# Patient Record
Sex: Male | Born: 1950 | Race: White | Hispanic: No | Marital: Married | Smoking: Never smoker
Health system: Southern US, Community
[De-identification: ages and names within clinical notes are randomized; demographics above are authoritative.]

## PROBLEM LIST (undated history)

## (undated) DIAGNOSIS — I252 Old myocardial infarction: Secondary | ICD-10-CM

## (undated) DIAGNOSIS — E785 Hyperlipidemia, unspecified: Secondary | ICD-10-CM

## (undated) DIAGNOSIS — K219 Gastro-esophageal reflux disease without esophagitis: Secondary | ICD-10-CM

## (undated) DIAGNOSIS — M1990 Unspecified osteoarthritis, unspecified site: Secondary | ICD-10-CM

## (undated) DIAGNOSIS — D649 Anemia, unspecified: Secondary | ICD-10-CM

## (undated) DIAGNOSIS — I2510 Atherosclerotic heart disease of native coronary artery without angina pectoris: Secondary | ICD-10-CM

## (undated) DIAGNOSIS — I219 Acute myocardial infarction, unspecified: Secondary | ICD-10-CM

## (undated) DIAGNOSIS — I10 Essential (primary) hypertension: Secondary | ICD-10-CM

## (undated) HISTORY — PX: TONSILLECTOMY: SUR1361

## (undated) HISTORY — PX: HAND SURGERY: SHX662

## (undated) HISTORY — DX: Hyperlipidemia, unspecified: E78.5

## (undated) HISTORY — DX: Atherosclerotic heart disease of native coronary artery without angina pectoris: I25.10

---

## 2003-10-03 ENCOUNTER — Ambulatory Visit (HOSPITAL_BASED_OUTPATIENT_CLINIC_OR_DEPARTMENT_OTHER): Admission: RE | Admit: 2003-10-03 | Discharge: 2003-10-03 | Payer: Self-pay | Admitting: Urology

## 2003-10-09 ENCOUNTER — Ambulatory Visit (HOSPITAL_COMMUNITY): Admission: RE | Admit: 2003-10-09 | Discharge: 2003-10-09 | Payer: Self-pay | Admitting: Urology

## 2006-09-22 DIAGNOSIS — I219 Acute myocardial infarction, unspecified: Secondary | ICD-10-CM

## 2006-09-22 HISTORY — DX: Acute myocardial infarction, unspecified: I21.9

## 2007-06-01 ENCOUNTER — Ambulatory Visit (HOSPITAL_COMMUNITY): Admission: RE | Admit: 2007-06-01 | Discharge: 2007-06-01 | Payer: Self-pay | Admitting: Ophthalmology

## 2007-06-23 HISTORY — PX: CORONARY ARTERY BYPASS GRAFT: SHX141

## 2007-06-25 HISTORY — PX: CARDIAC CATHETERIZATION: SHX172

## 2007-06-29 ENCOUNTER — Ambulatory Visit: Payer: Self-pay | Admitting: Surgery

## 2007-07-01 ENCOUNTER — Ambulatory Visit: Payer: Self-pay | Admitting: Surgery

## 2007-07-01 ENCOUNTER — Inpatient Hospital Stay (HOSPITAL_COMMUNITY): Admission: RE | Admit: 2007-07-01 | Discharge: 2007-07-06 | Payer: Self-pay | Admitting: Surgery

## 2007-07-22 ENCOUNTER — Encounter: Admission: RE | Admit: 2007-07-22 | Discharge: 2007-07-22 | Payer: Self-pay | Admitting: Surgery

## 2007-07-22 ENCOUNTER — Ambulatory Visit: Payer: Self-pay | Admitting: Surgery

## 2007-07-29 ENCOUNTER — Encounter (HOSPITAL_COMMUNITY): Admission: RE | Admit: 2007-07-29 | Discharge: 2007-09-22 | Payer: Self-pay | Admitting: Cardiovascular Disease

## 2007-08-27 HISTORY — PX: TRANSTHORACIC ECHOCARDIOGRAM: SHX275

## 2009-01-29 ENCOUNTER — Encounter: Admission: RE | Admit: 2009-01-29 | Discharge: 2009-01-29 | Payer: Self-pay | Admitting: Gastroenterology

## 2010-10-13 ENCOUNTER — Encounter: Payer: Self-pay | Admitting: Surgery

## 2011-02-04 NOTE — Discharge Summary (Signed)
Harold Garner, Harold Garner              ACCOUNT NO.:  [id number]   MEDICAL RECORD NO.:  [id number]          PATIENT TYPE:  INP   LOCATION:  2019                         FACILITY:  [facility]   PHYSICIAN:  Evelene Croon, M.D.     DATE OF BIRTH:  09/25/1950   DATE OF ADMISSION:  07/01/2007  DATE OF DISCHARGE:                               DISCHARGE SUMMARY   FINAL DIAGNOSIS:  Severe three-vessel coronary artery disease.   IN-HOSPITAL DIAGNOSIS:  Acute blood loss anemia postoperatively.   SECONDARY DIAGNOSES:  1. Hypertension.  2. Hyperlipidemia.  3. Status post cataract surgery.   IN-HOSPITAL OPERATIONS AND PROCEDURES:  1. Coronary artery bypass grafting x4 using a left internal mammary      artery graft to the left anterior descending coronary, sequential      saphenous vein graft to first and second obtuse marginal branches,      saphenous vein graft to posterior descending branch.  2. Endoscopic vein harvesting from right leg.   THE PATIENT'S HISTORY AND PHYSICAL AND HOSPITAL COURSE:  The patient is  a 60 year old gentleman who was referred by Dr. Allyson Sabal, who was in his  usual state of health until he recently underwent cataract surgery.  After that surgery it was felt the patient would require a second, more  complicated surgery under general anesthesia and he was sent to his  primary care physician for cardiac clearance.  EKG showed inferior T-  wave inversions in leads II, III and aVF.  The patient was referred to  cardiology for evaluation.  Echocardiogram done on June 08, 2007,  showed mild concentric left ventricular hypertrophy.  Left ventricular  function was normal.  Ejection fraction was greater than 55%.  The  transmitral Doppler flow pattern was suggestive of impaired left  ventricular relaxation.  There is no aortic or mitral valvular disease  noted.  The patient underwent a stress Myoview examination, which showed  significant ST elevation in inferior leads with  exercise which resulted  in end of the study.  The patient also had mild chest pain that resolved  after the study.  The stress images also showed to be abnormal.  Ejection fraction was 44%.  The patient was then taken for cardiac  catheterization June 25, 2007, which showed severe three-vessel  coronary artery disease.  Post catheterization, Dr. Laneta Simmers was  consulted.  Dr. Laneta Simmers discussed with the patient undergoing coronary  artery bypass grafting.  He discussed the risks and benefits with the  patient.  The patient acknowledged understanding and agreed to proceed.  Surgery was scheduled for July 01, 2007.  For details of the patient's  past medical history and physical exam, please see dictated H&P.   The patient was taken to the operating room July 01, 2007, where he  underwent coronary artery bypass grafting x4 using a left internal  mammary artery graft to the left anterior descending coronary,  sequential saphenous vein graft to first and second obtuse marginal  branches of the left circumflex coronary, saphenous vein graft to  posterior descending branch of the right coronary.  Endoscopic vein  harvesting from  the right leg was done.  The patient tolerated this  procedure well and was transferred to the intensive care unit in stable  condition.  Postoperatively the patient was noted to be hemodynamically  stable.  He was extubated the evening of surgery.  Post extubation the  patient noted to be alert and oriented x3, neuro intact.  The patient's  postoperative course was pretty much unremarkable.  While in the  intensive care unit, the patient's vital signs were monitored.  He was  able to be weaned from all drips.  Swan-Ganz catheter discontinued in  normal fashion.  Postoperative chest x-ray was stable.  He had minimal  drainage from chest tubes and chest tubes discontinued in a normal  fashion.  He did have slight acute blood loss anemia postop day #1 with  hemoglobin  and hematocrit of 9.4 and 28%.  This was followed closely.  By postop day #2, the patient remained stable.  He was out of bed  ambulating with cardiac rehab.  He was on room air, saturating greater  than 90%.  He did have slight volume overload and was started on  diuretics.  Hemoglobin and hematocrit remained stable postop day #2.  The patient was transferred up to 2000 postop day #2.  While on  telemetry floor, the patient's vital signs continued to be followed.  They remained stable.  He remained afebrile.  He remained saturating  greater than 90% on room air.  His weight was checked daily and his  volume overload was improving.  Blood sugars were followed  postoperatively and remained stable.  The patient was noted to have  preoperatively a hemoglobin A1c of 6.1.  The patient has no prior  history of diabetes mellitus.  Blood sugars were stable postoperatively.  The patient remained in normal sinus rhythm postoperatively.  Pulmonary  status was stable.  Incisions clean, dry and intact and healing well.  He continued to progress well with ambulation.  He was tolerating diet  well.  No nausea or vomiting noted.   The patient is tentatively ready for discharge home over the next 48  hours pending he remains stable.   FOLLOW-UP APPOINTMENTS:  A follow-up appointment will be arranged with  Dr. Laneta Simmers for in 3 weeks.  Our office will contact the patient with  this information.  The patient will need to obtain a PA and lateral  chest x-ray 30 minutes prior to this appointment.  The patient will need  to follow up with Dr. Allyson Sabal in 2 weeks.  He will need to contact Dr.  Hazle Coca office to make these arrangements.   ACTIVITY:  Patient instructed no driving until released to do so, no  heavy lifting over 10 pounds.  He was told to ambulate 3-4 times per  day, progress as tolerated, and to continue his breathing exercises.   DIET:  The patient was educated on diet to be low-fat, low-salt.    INCISIONAL CARE:  The patient was told to shower, washing his incisions  using soap and water.  He is to contact the office if he develops any  drainage or opening from any of his incision sites.   DISCHARGE MEDICATIONS:  1. Aspirin 325 mg daily.  2. Toprol XL 25 mg daily.  3. Crestor 5 mg daily.  4. Lumigan 1 both eyes daily.  5. Combigan one drop left eye b.i.d.  6. Omnipred one drop four times per day.  7. Prilosec 20 mg daily.  8. Diamox 500  mg daily.  9. Lasix 40 mg daily x3 days.  10.Potassium chloride 20 mEq daily x3 days.  11.Oxycodone 5 mg one to two tablets q.4-6h. p.r.n. pain.      Theda Belfast, [place]      Evelene Croon, M.D.  Electronically Signed    KMD/MEDQ  D:  07/04/2007  T:  07/05/2007  Job:  [id number]   cc:   Nanetta Batty, M.D.

## 2011-02-04 NOTE — Consult Note (Signed)
NEW PATIENT CONSULTATION   Harold Garner, HAUK  DOB:  1950/12/06                                        June 29, 2007  CHART #:  [id number]   REFERRING PHYSICIAN:  Dr. Nanetta Batty.   REASON FOR CONSULTATION:  Severe 3-vessel coronary disease.   CLINICAL HISTORY:  I was asked by Dr. Nanetta Batty to evaluate this 60-  year-old gentleman for consideration of coronary artery bypass graft  surgery.  He said that he was in his usual state of health until he  recently underwent cataract surgery.  Unfortunately the ophthalmologist  was unable to completely remove the cataract and it was felt that he  needed a second surgery under general anesthesia with a larger incision  in the eye to fix the problem.  He was sent to his primary care  physician, Dr. Georgina Pillion, for clearance and his electrocardiogram was  abnormal with inferior T wave inversions in leads II, III and aVF.  He  was therefore referred for cardiology evaluation.  He subsequently  underwent an echocardiogram on June 08, 2007 which showed mild  concentric left ventricular hypertrophy.  Left ventricular function was  normal with an ejection fraction of greater than 55%.  The transmitral  spectral Doppler flow pattern is suggestive of impaired left ventricular  relaxation.  There was no significant aortic or mitral valvular disease  noted.  He subsequently underwent a stress-Myoview examination which  showed significant ST elevation in inferior leads with exercise which  resolved at the end of the study.  The patient reported 2 out of 10  chest pain that resolved at the end of the study.  The stress images  showed mild to moderate perfusion defect in the apical septal, apical,  basilar anterior, mid anterior, and apical anterior walls.  There was  moderate defect reversibility.  There was also a large perfusion defect  in the apical inferior, basal inferior, mid inferoseptal, and mid  inferior wall with  moderate defect reversibility.  There was also a mild  to moderate perfusion defect in the basal anterolateral, mid  anterolateral, apical lateral walls with moderate defect reversibility.  Ejection fraction was measured at 44%.  This was felt to be a high-risk  scan and the patient subsequently underwent cardiac catheterization on  June 25, 2007 which shows severe 3-vessel disease.  The left main was  normal.  The LAD had a 90% hypodense lesion in the proximal segment  before the diagonal branch and the septal perforator.  The left  circumflex had total occlusion in the mid portion in the AV groove.  The  first marginal was also totally occluded and its mid portion with  filling by collaterals.  The right coronary artery was a dominant vessel  with 80% proximal stenosis followed by a total occlusion in the mid  portion with left-to-right collaterals filling the distal vessel.  Ejection fraction was measured at 30-35% with moderate inferobasal and  posterolateral hypokinesis.   REVIEW OF SYSTEMS:  GENERAL:  He denies any fever or chills.  He has had  no recent weight changes.  He has had mild fatigue.  EYES:  He has  problems with cataracts and underwent recent cataract surgery as  mentioned above. He has been seen by Dr. Jettie Pagan and Dr. Ashley Royalty.  ENDOCRINE:  He denies diabetes and hypothyroidism.  CARDIOVASCULAR:  As  above, he has had occasional episodes of chest tightness which he  figured was due to reflux.  He does have some orthopnea and dyspnea with  exertion.  He denies peripheral edema.  RESPIRATORY:  He denies cough  and sputum production.  GI:  He has a history of reflux-type symptoms.  He denies nausea or vomiting.  He denies dysphagia.  He has not had any  melena or bright red blood per rectum.  GU:  He denies dysuria,  hematuria.  VASCULAR:  He denies claudication and phlebitis.  NEUROLOGICAL:  He denies any focal weakness or numbness.  He denies  dizziness and syncope.  He  has never had a TIA or a stroke.  MUSCULOSKELETAL:  He does have arthralgias.  PSYCHIATRIC:  Negative.  HEMATOLOGICAL:  Negative.   ALLERGIES:  DARVON (CAUSES HYPERACTIVITY).   MEDICATIONS:  Micardis 80 mg daily, Prilosec OTC daily, baby aspirin 81  mg daily, Toprol XL 25 mg daily, Crestor 5 mg daily, Diamox two daily,  Combigen eye drops 2 per day, Lumigan ophthalmic drops 1 per day, and  steroid eye drops 4 per day.   PAST MEDICAL HISTORY:  1. Hypertension for approximately 5 years.  2. History of hyperlipidemia.   PAST SURGICAL HISTORY:  He has had no previous surgery.   SOCIAL HISTORY:  He is married and has no children.  He lives with his  wife.  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  He  smoked cigarettes in the past but quit.  He smokes about one cigar per  day.  He drinks an occasional beer.   FAMILY HISTORY:  His mother died at 55 of a heart attack.  Father died  at 54 of cancer.  He has brothers who have heart disease.   PHYSICAL EXAMINATION:  VITAL SIGNS:  His blood pressure is 117/77, his  pulse is 52 and regular, respiratory rate is 18 and unlabored.  Oxygen  saturation on room air is 98%.  GENERAL:  He is a well-developed white male in no distress.  HEENT EXAM:  Shows him to be normocephalic and atraumatic.  Pupils are  equal, round and reactive to light and accommodation. Extraocular  muscles are intact.  His throat is clear.  NECK EXAM:  Shows normal carotid pulses bilaterally.  There are no  bruits. There is no adenopathy or thyromegaly.  CARDIAC EXAM:  Shows a regular rate and rhythm with normal S1 and S2.  There is no murmur, rub, or gallop.  LUNGS:  Clear.  ABDOMEN:  Active bowel sounds.  His abdomen is soft and nontender with  no palpable masses or organomegaly.  EXTREMITIES:  No peripheral edema. Pedal pulses are palpable  bilaterally.  SKIN:  Warm and dry.  NEUROLOGIC EXAM:  Shows him to be alert and oriented x3.  Motor and  sensory  exams are grossly normal.   LABORATORY EXAMINATION:  His carotid Doppler examination shows 0-49%  internal carotid artery stenosis bilaterally.  His blood test on  June 21, 2007 showed a hemoglobin of 16, hematocrit of 46, platelet  count 264,000.  His coagulation profile was normal.  Electrolytes were  normal with a BUN of 11, creatinine 0.6 and glucose of 129.  Urinalysis  was negative.  His TSH level was 1.87 which is within normal limits.   IMPRESSION:  Mr. Yi has severe 3-vessel coronary disease with a  markedly positive stress-test.  He has an occluded left circumflex and  right coronary arteries with high-grade proximal LAD stenosis.  I agree  that coronary artery bypass graft surgery is the best treatment to  prevent further ischemia and infarction and death.  I discussed the  operative procedure with the patient and his wife including benefits and  risks.  We discussed the risk of bleeding,  blood transfusion, infection, stroke, myocardial infarction, graft  failure, and death.  He understands and would like to proceed as quickly  as possible.  We will plan to do surgery this Thursday, July 01, 2007.   Evelene Croon, M.D.  Electronically Signed   BB/MEDQ  D:  06/29/2007  T:  06/29/2007  Job:  [id number]   cc:   Oley Balm. Georgina Pillion, M.D.  Nanetta Batty, M.D.

## 2011-02-04 NOTE — Discharge Summary (Signed)
Harold Garner, Harold Garner              ACCOUNT NO.:  [id number]   MEDICAL RECORD NO.:  [id number]          PATIENT TYPE:  INP   LOCATION:  2019                         FACILITY:  [facility]   PHYSICIAN:  Theda Belfast, PA DATE OF BIRTH:  08/03/1951   DATE OF ADMISSION:  07/01/2007  DATE OF DISCHARGE:  07/06/2007                               DISCHARGE SUMMARY   ADDENDUM:  The patient will be ready for discharge home within the next  48 hours if he remains stable.  The patient did remain stable.  Vital  signs continued to be monitored.  He was afebrile, breathing on room air  with saturations greater than 90%.  He remained in normal sinus rhythm.  Pulmonary status stable.  All incisions were clean, dry, and intact.  The patient was ready for discharge home postop day 5, July 06, 2007.  Please see dictated discharge summary for followup appointments and  discharge instructions.   DISCHARGE MEDICATIONS:  1. Aspirin 325 mg daily.  2. Toprol XL 25 mg daily.  3. Crestor 5 mg daily.  4. Lumigan 1 drop to left eye daily.  5. Combigan 1 drop left eye b.i.d.  6. Omni-Ped 1 drop 4 times per day.  7. Prilosec OTC 20 mg daily.  8. Diamox 400 mg daily.  9. Lasix 40 mg daily x3 days.  10.Potassium chloride 20 mEq daily x3 days.  11.Oxycodone 5 mg one to two tabs every 4-6 hours p.r.n. pain.      Theda Belfast, PA     KMD/MEDQ  D:  08/25/2007  T:  08/25/2007  Job:  [id number]   cc:   Evelene Croon, M.D.  Nanetta Batty, M.D.

## 2011-02-04 NOTE — Op Note (Signed)
NAMEJERED, Harold Garner              ACCOUNT NO.:  [id number]   MEDICAL RECORD NO.:  [id number]          PATIENT TYPE:  INP   LOCATION:  2305                         FACILITY:  [facility]   PHYSICIAN:  Evelene Croon, M.D.     DATE OF BIRTH:  1951-05-17   DATE OF PROCEDURE:  07/01/2007  DATE OF DISCHARGE:                               OPERATIVE REPORT   PREOPERATIVE DIAGNOSIS:  Severe three vessel coronary disease.   POSTOPERATIVE DIAGNOSIS:  Severe three vessel coronary disease.   OPERATIVE PROCEDURE:  Median sternotomy, extracorporeal circulation,  coronary bypass graft surgery x4 using a left internal mammary artery  graft to left anterior descending coronary, with a sequential saphenous  vein graft to the first and second obtuse marginal branches of the left  circumflex coronary, and a saphenous vein graft to the posterior  descending branch of the right coronary.  Endoscopic vein harvesting  from the right leg.   ATTENDING SURGEON:  Evelene Croon, M.D.   ASSISTANT:  Rowe Clack, P.A.-C.   ANESTHESIA:  General endotracheal.   CLINICAL HISTORY:  This patient is a 60 year old gentleman, referred by  Dr. Nanetta Batty, who was in his usual state of health until he  recently underwent cataract surgery.  After that surgery, it was felt  the patient would require a second more complicated surgery under  general anesthesia and he was sent to his primary care physician for  cardiac clearance.  Electrocardiogram showed inferior T-wave inversions  in leads 2, 3, and AVF, and he was referred to cardiology for  evaluation.  An echocardiogram on June 08, 2007, showed mild  concentric left ventricular hypertrophy.  Left ventricular function was  normal and ejection fraction was greater than 55%.  The transmitral  Doppler flow pattern was suggestive of impaired left ventricular  relaxation.  There is no aortic or mitral valvular disease noted.  He  had a stress Myoview examination which  showed significant ST elevation  in the inferior leads with exercise which resolved at the end of the  study.  The patient also had mild chest pain that resolved after the  study.  The stress images showed moderate perfusion defect throughout  the anterior, lateral, inferior walls with moderate reversibility.  Ejection fraction was 44%.   This was felt to be a high risk scan and the patient underwent cardiac  catheterization on October 3 which showed severe three vessel disease.  The left main was short and normal.  The LAD had a 90% hypodense lesion  in the proximal segment before the diagonal branch and the septal  perforator.  The left circumflex had total occlusion in the mid portion.  The first marginal branch was also totally occluded in its mid portion  and filled by collaterals.  The right coronary was a dominant vessel  with 80% proximal stenosis followed by total occlusion of the mid  portion with left-to-right collaterals filling the distal vessel.  Ejection fraction was estimated at 30-35% with moderate inferior basal  and posterolateral hypokinesis.  After review of the angiogram and  examination of the patient, I felt that  coronary bypass graft surgery  was the best treatment to further ischemia and infarction.  I discussed  the operative procedure with the patient and his wife including  alternatives, benefits, and risks including but not limited to bleeding,  blood transfusion, infection, stroke, myocardial infarction, graft  failure, and death.  He understood and agreed to proceed.   OPERATIVE PROCEDURE:  The patient was taken to the operating room and  placed on the table in a supine position.  After induction of general  endotracheal anesthesia, a Foley catheter was placed in the bladder  using sterile technique.  Then, the chest, abdomen and both lower  extremities were prepped and draped in the usual sterile manner.  The  chest was entered through a median  sternotomy incision and the  pericardium opened in the midline.  Examination of the heart showed good  ventricular contractility.  The ascending aorta was of normal size and  had no palpable plaques in it.   Then, the left internal mammary artery was harvested from the chest wall  as a pedicle graft.  This was a medium caliber vessel with excellent  blood flow through it.  At the same time, a segment of greater saphenous  vein was harvested from the right leg using endoscopic vein harvest  technique.  This vein was of medium size and good quality.   Then, the patient was heparinized and when an adequate activated  clotting time was achieved, the distal ascending aorta was cannulated  using a 20 French aortic cannula for arterial inflow.  Venous outflow  was achieved using a two stage venous cannula through the right atrial  appendage.  An antegrade cardioplegia and vent cannula was inserted in  the aortic root.   The patient was placed on cardiopulmonary bypass and the distal  coronaries identified.  The LAD was intramyocardial throughout its  entire extent and never was seen on the surface of the heart.  I was  able to localize this artery in its mid portion.  It was a medium size  graftable vessel that was lying deep in the myocardium.  The left  circumflex gave off a small first marginal that was graftable and a  moderate size second marginal that was graftable.  The right coronary  artery gave off a moderate sized posterior descending branch that was  suitable for grafting and also had a smaller posterolateral branch that  was not large enough to graft.  These appeared to communicate on  catheterization.   Then, the aorta was crossclamped and 1000 mL of cold blood antegrade  cardioplegia was administered in the aortic root with quick arrest the  heart.  Systemic hypothermia to 28 degrees centigrade and topical  hypothermia with iced saline was used.  A temperature probe was  placed  in the septum and insulating pad in the pericardium.   The first distal anastomosis was performed to the posterior descending  coronary.  The internal diameter of this vessel was about 1.75 mm.  The  conduit used was a segment of greater saphenous vein and the anastomosis  was performed in an end-to-side manner using continuous 7-0 Prolene  suture.  Flow was noted through the graft and was excellent.  Then, a  dose of cardioplegia was given down this vein graft.   The second distal anastomosis was performed to the first obtuse marginal  branch.  The internal diameter was about 1.5 mm.  The conduit used was a  second segment of greater  saphenous vein.  The anastomosis was performed  in a sequential side-to-side manner using continuous 7-0 Prolene suture.  Flow was noted through the graft and was excellent.   The third distal anastomosis was performed to the second marginal  branch.  The internal diameter of this vessel was about 1.6 mm.  The  conduit used was the same segment of greater saphenous vein and the  anastomosis was performed in a sequential end-to-side manner using  continuous 7-0 Prolene suture.  Flow was noted through the graft and was  excellent.  Then, another dose of cardioplegia was given down both vein  grafts and in the aortic root.   The fourth distal anastomosis was performed to the mid portion of the  left anterior descending coronary artery.  The internal diameter of this  vessel was about 2 mm.  The conduit used was the left internal mammary  graft and this was brought through an opening in the left pericardium  anterior to the phrenic nerve.  It was anastomosed to the LAD in an end-  to-side manner using continuous 8-0 Prolene suture.  The anastomosis was  checked for hemostasis and then the pedicle was sutured to the  epicardium with 6-0 Prolene sutures.   The patient was then rewarmed to 37 degrees centigrade.  Another dose of  cardioplegia given  and then with the crossclamp in place, the two  proximal vein graft anastomosis were performed to the aortic root in an  end-to-side manner using continuous 6-0 Prolene suture.  The clamp was  removed from the mammary pedicle.  There was rapid warming of the  ventricular septum and return of spontaneous ventricular fibrillation.  The crossclamp was removed with a time of 91 minutes and the patient was  defibrillated into sinus rhythm.  The proximal and distal anastomoses  appeared hemostatic and the lie of the grafts satisfactory.  Graft  markers were placed around the proximal anastomoses.  Two temporary  right ventricular and right atrial pacing wires were placed and brought  through the skin.   When the patient had rewarmed to 37 degrees centigrade, he was weaned  from cardiopulmonary bypass on no inotropic agents.  Total bypass time  was 111 minutes.  Cardiac function appeared excellent with a cardiac  output of 6 liters minute.  Protamine was given and venous and aortic  cannulae were removed without difficulty.  Hemostasis was achieved.  Three chest tubes were placed with two in the posterior pericardium, one  in the left pleural space, and one in the anterior mediastinum.  The  sternum was then closed with double #6 stainless steel wires.  The  fascia was closed with continuous #1 Vicryl suture.  The subcu tissue  was closed with continuous 2-0 Vicryl and the skin with 3-0 Vicryl  subcuticular closure.  The lower extremity vein harvest site was closed  in layers in a similar manner.  The sponge, needle and instrument counts  were correct according to the scrub nurse.  Dry sterile dressing was  applied over the incisions and around the chest tubes which were hooked  to Pleur-Evac suction.  The patient remained hemodynamically stable and  was transferred to the SICU in guarded but stable condition.      Evelene Croon, M.D.  Electronically Signed     BB/MEDQ  D:  07/01/2007   T:  07/01/2007  Job:  [id number]   cc:   Nanetta Batty, M.D.

## 2011-02-04 NOTE — Assessment & Plan Note (Signed)
OFFICE VISIT   Harold, Garner  DOB:  [birth year]                                        July 22, 2007  CHART #:  [id number]   Mr. Harold Garner returns for followup today status post coronary artery bypass  graft surgery x4 on 07/01/07.  He has been feeling well overall and is  walking daily without chest pain or shortness of breath.   PHYSICAL EXAMINATION:  VITAL SIGNS:  Blood pressure 133/84, pulse 78 and  regular, respiratory rate 18 unlabored, oxygen saturation on room air is  99%.  GENERAL:  He looks well.  CARDIOVASCULAR:  Regular rate and rhythm with normal heart sounds.  LUNGS:  His lung exam is clear.  CHEST:  The chest incision is healing well, the sternum is stable.  His leg incision is healing well and there is no lower extremity edema.   Followup chest x-ray shows a small left pleural effusion.  Lungs are,  otherwise, clear.   His medications are:  1. Toprol XL 25 mg daily.  2. Crestor 5 mg daily.  3. Diamox 500 mg daily.  4. Prilosec OTC 20 mg daily.  5. Lumigan eye drops.  6. Combigan eye drops.  7. Omnipred eye drops.   IMPRESSION:  Overall, Harold Garner is recovering very well following his  surgery.  I encouraged him to continue walking as much as possible.  I  told him he could return to driving a car, should refrain from lifting  anything heavier than 10 lb for a total of 3 months from date of  surgery.  He will continue to followup Dr. Nanetta Batty and will  contact me if he develops any problems with his incisions.   Evelene Croon, M.D.  Electronically Signed   BB/MEDQ  D:  07/22/2007  T:  07/23/2007  Job:  [id number]   cc:   Nanetta Batty, M.D.  Oley Balm Georgina Pillion, M.D.

## 2011-02-07 NOTE — Op Note (Signed)
NAME:  Harold Garner, Harold Garner                        ACCOUNT NO.:  [id number]   MEDICAL RECORD NO.:  [id number]                   PATIENT TYPE:  AMB   LOCATION:  [facility]                                 FACILITY:  [facility]   PHYSICIAN:  Claudette Laws, M.D.               DATE OF BIRTH:  1951-01-11   DATE OF PROCEDURE:  10/03/2003  DATE OF DISCHARGE:                                 OPERATIVE REPORT   PREOPERATIVE DIAGNOSES:  A 1 cm x 5 mm mid left ureteral stone with  hydronephrosis and renal colic.   POSTOPERATIVE DIAGNOSES:  A 1 cm x 5 mm mid left ureteral stone with  hydronephrosis and renal colic.   OPERATION:  1. Cystoscopy.  2. Left retrograde pyelogram.  3. Insertion of a 6 French 28 cm double-J stent.   SURGEON:  Claudette Laws, M.D.   HISTORY:  This is a 60 year old man, who presented to my office yesterday  with severe left-sided renal colic and by noncontrast CT, about 1 cm x 4-5  mm stone in the proximal left ureter with high-grade hydronephrosis.  He was  given a shot of Toradol and then I thought because of the degree of  obstruction and the severe pain he was having, we should go ahead and put up  a double-J stent and then consider him for a lithotripsy.  Both he and his  wife understood and agreed to the proposed follow-up.   DESCRIPTION OF PROCEDURE:  The patient was prepped and draped in the dorsal  lithotomy position under LMA anesthesia.  Cystoscopy was performed with a 22  French rigid cystoscope.  He had a normal anterior urethra, some early BPH,  anterior notching.  The bladder itself looked normal, smooth, no tumors, no  calculi, and normal ureteral orifices.   Initially I passed up a 0.038 Glidewire using fluoroscopic control.  This  was passed through a 6 [place] open-ended ureteral catheter.  We then  performed left retrograde study, showing the left hydronephrosis.  No  obvious filling defects.  We were able to bypass the stone.  I then passed  the Glidewire  back up, and we put up in a 6 French 26 cm stent which proved  to be too short, as it had migrated out of the pelvis.  So I converted to a  6 French 28 cm double-J stent, and this seemed to curl up nicely in the left  renal pelvis.  The distal end was curled up in the bladder.  The bladder was  emptied, and all instruments were removed.  A B&O suppository was placed per  rectum for bladder spasms and 10 mL of 1% Xylocaine jelly was placed per  urethra for anesthetic purposes.  The patient was then taken back to the  recovery room in satisfactory condition.  Claudette Laws, M.D.    RFS/MEDQ  D:  10/03/2003  T:  10/03/2003  Job:  [id number]

## 2011-06-25 HISTORY — PX: NM MYOCAR PERF WALL MOTION: HXRAD629

## 2011-07-03 LAB — I-STAT EC8
BUN: 8
BUN: 9
Bicarbonate: 20.6
Chloride: 105
Glucose, Bld: 100 — ABNORMAL HIGH
Glucose, Bld: 83
Hemoglobin: 10.2 — ABNORMAL LOW
Hemoglobin: 9.2 — ABNORMAL LOW
Potassium: 3.5
Potassium: 3.6
Sodium: 140
Sodium: 143
pH, Arterial: 7.362

## 2011-07-03 LAB — CBC
HCT: 28 — ABNORMAL LOW
HCT: 30.2 — ABNORMAL LOW
HCT: 42.5
Hemoglobin: 11.1 — ABNORMAL LOW
Hemoglobin: 9.2 — ABNORMAL LOW
Hemoglobin: 9.4 — ABNORMAL LOW
Hemoglobin: 9.8 — ABNORMAL LOW
MCHC: 33.9
MCV: 90
Platelets: 142 — ABNORMAL LOW
Platelets: 143 — ABNORMAL LOW
Platelets: 190
Platelets: 228
RBC: 3.15 — ABNORMAL LOW
RDW: 12.6
RDW: 12.8
RDW: 12.9
RDW: 13.2
RDW: 13.3
WBC: 10.9 — ABNORMAL HIGH
WBC: 11 — ABNORMAL HIGH
WBC: 11.8 — ABNORMAL HIGH

## 2011-07-03 LAB — POCT I-STAT 3, ART BLOOD GAS (G3+)
Acid-base deficit: 1
Acid-base deficit: 2
Acid-base deficit: 4 — ABNORMAL HIGH
Bicarbonate: 20.7
Bicarbonate: 21.7
Bicarbonate: 24.1 — ABNORMAL HIGH
O2 Saturation: 99
Operator id: 261841
Operator id: 3390
Patient temperature: 37
Patient temperature: 37.4
TCO2: 22
TCO2: 23
TCO2: 25
pCO2 arterial: 34.8 — ABNORMAL LOW
pH, Arterial: 7.37
pH, Arterial: 7.381
pH, Arterial: 7.394
pO2, Arterial: 270 — ABNORMAL HIGH
pO2, Arterial: 332 — ABNORMAL HIGH
pO2, Arterial: 56 — ABNORMAL LOW

## 2011-07-03 LAB — BASIC METABOLIC PANEL
Calcium: 8.1 — ABNORMAL LOW
Chloride: 112
GFR calc Af Amer: >60
GFR calc non Af Amer: >60
GFR calc non Af Amer: >60
GFR calc non Af Amer: >60
Glucose, Bld: 105 — ABNORMAL HIGH
Glucose, Bld: 117 — ABNORMAL HIGH
Potassium: 3.8
Potassium: 3.8
Sodium: 138
Sodium: 139
Sodium: 141

## 2011-07-03 LAB — HEMOGLOBIN A1C: Mean Plasma Glucose: 140

## 2011-07-03 LAB — POCT I-STAT 4, (NA,K, GLUC, HGB,HCT)
Glucose, Bld: 107 — ABNORMAL HIGH
Glucose, Bld: 114 — ABNORMAL HIGH
Glucose, Bld: 121 — ABNORMAL HIGH
Glucose, Bld: 83
HCT: 26 — ABNORMAL LOW
HCT: 28 — ABNORMAL LOW
HCT: 28 — ABNORMAL LOW
Hemoglobin: 8.8 — ABNORMAL LOW
Operator id: 261841
Operator id: 299391
Operator id: 3390
Operator id: 3390
Potassium: 3.5
Potassium: 4.3
Potassium: 5.1
Sodium: 136
Sodium: 138
Sodium: 139
Sodium: 143

## 2011-07-03 LAB — COMPREHENSIVE METABOLIC PANEL
Albumin: 3.4 — ABNORMAL LOW
BUN: 10
Calcium: 9.1
Creatinine, Ser: 0.9
Total Protein: 6.5

## 2011-07-03 LAB — URINALYSIS, ROUTINE W REFLEX MICROSCOPIC
Hgb urine dipstick: NEGATIVE
Specific Gravity, Urine: 1.01
Urobilinogen, UA: 0.2
pH: 7.5

## 2011-07-03 LAB — POCT I-STAT 3, VENOUS BLOOD GAS (G3P V)
O2 Saturation: 89
Patient temperature: 30
pCO2, Ven: 27.5 — ABNORMAL LOW
pH, Ven: 7.473 — ABNORMAL HIGH

## 2011-07-03 LAB — TYPE AND SCREEN
ABO/RH(D): A NEG
Antibody Screen: NEGATIVE

## 2011-07-03 LAB — BLOOD GAS, ARTERIAL
Acid-base deficit: 3.2 — ABNORMAL HIGH
Patient temperature: 98.6
TCO2: 21.8
pH, Arterial: 7.404

## 2011-07-03 LAB — POCT I-STAT GLUCOSE
Glucose, Bld: 118 — ABNORMAL HIGH
Operator id: 3390

## 2011-07-03 LAB — CREATININE, SERUM
Creatinine, Ser: 0.82
Creatinine, Ser: 0.88
GFR calc non Af Amer: >60

## 2011-07-03 LAB — APTT
aPTT: 36
aPTT: 46 — ABNORMAL HIGH

## 2011-07-03 LAB — PLATELET COUNT: Platelets: 156

## 2011-07-03 LAB — PROTIME-INR
INR: 1.3
Prothrombin Time: 13.5

## 2011-07-03 LAB — ABO/RH: ABO/RH(D): A NEG

## 2012-12-31 ENCOUNTER — Emergency Department
Admission: EM | Admit: 2012-12-31 | Discharge: 2012-12-31 | Disposition: A | Discharge status: Home / Self Care | Payer: BC Managed Care – PPO | Source: Home / Self Care | Attending: Family Medicine | Admitting: Family Medicine

## 2012-12-31 ENCOUNTER — Encounter: Payer: Self-pay | Admitting: *Deleted

## 2012-12-31 DIAGNOSIS — L03031 Cellulitis of right toe: Secondary | ICD-10-CM

## 2012-12-31 DIAGNOSIS — L03039 Cellulitis of unspecified toe: Secondary | ICD-10-CM

## 2012-12-31 HISTORY — DX: Old myocardial infarction: I25.2

## 2012-12-31 HISTORY — DX: Essential (primary) hypertension: I10

## 2012-12-31 MED ORDER — DOXYCYCLINE HYCLATE 100 MG PO CAPS: 100.0000 mg | ORAL_CAPSULE | Freq: Two times a day (BID) | ORAL | Status: AC

## 2012-12-31 MED ORDER — METRONIDAZOLE 500 MG PO TABS: 500.0000 mg | ORAL_TABLET | Freq: Three times a day (TID) | ORAL | Status: DC

## 2012-12-31 NOTE — ED Provider Notes (Signed)
History     CSN: [id number]  Arrival date & time 12/31/12  6295   First MD Initiated Contact with Patient 12/31/12 [phone]      Chief Complaint  Patient presents with  . toe infection     HPI   R great toe pain x 2 days.  Pt reports mild swelling and redness around nail bed.  Had similar episode last year on L great toe and had partial toe nail removal.  Pain is affecting both sides of nail bed.  No fevers or chills.  Non diabetic.   Past Medical History  Diagnosis Date  . Hypertension   . History of heart attack     Past Surgical History  Procedure Laterality Date  . Coronary artery bypass graft      Family History  Problem Relation Age of Onset  . Heart attack Mother     History  Substance Use Topics  . Smoking status: Light Tobacco Smoker  . Smokeless tobacco: Never Used  . Alcohol Use: Yes      Review of Systems  All other systems reviewed and are negative.    Allergies  Darvon  Home Medications   Current Outpatient Rx  Name  Route  Sig  Dispense  Refill  . aspirin 81 MG tablet   Oral   Take 81 mg by mouth daily.         . dorzolamide-timolol (COSOPT) 22.3-6.8 MG/ML ophthalmic solution      1 drop 2 (two) times daily.         Marland Kitchen ezetimibe (ZETIA) 10 MG tablet   Oral   Take 10 mg by mouth daily.         . metoprolol succinate (TOPROL-XL) 50 MG 24 hr tablet   Oral   Take 50 mg by mouth daily. Take with or immediately following a meal.           BP 145/85  Pulse 50  Temp(Src) 97.9 F (36.6 C) (Oral)  Resp 16  Ht 6\' 3"  (1.905 m)  Wt 255 lb (115.667 kg)  BMI 31.87 kg/m2  SpO2 98%  Physical Exam  Constitutional: He appears well-developed and well-nourished.  HENT:  Head: Normocephalic and atraumatic.  Eyes: Conjunctivae are normal. Pupils are equal, round, and reactive to light.  Neck: Normal range of motion.  Cardiovascular: Normal rate and regular rhythm.   Pulmonary/Chest: Effort normal.  Abdominal: Soft.    Musculoskeletal:       Feet:  Neurological: He is alert.  Skin: Skin is warm.    ED Course  Procedures (including critical care time)  Labs Reviewed - No data to display No results found.   1. Paronychia, toe, right       MDM  Discussed treatment options including toe nail avulsion.  Pt would like to try oral abx and soaks before toe nail removal.  Discussed general and infectious red flags.  Will place on course of doxy and flagyl given toe involvement.  Follow up if sxs worsen as will likely need toe nail avulsion.     The patient and/or caregiver has been counseled thoroughly with regard to treatment plan and/or medications prescribed including dosage, schedule, interactions, rationale for use, and possible side effects and they verbalize understanding. Diagnoses and expected course of recovery discussed and will return if not improved as expected or if the condition worsens. Patient and/or caregiver verbalized understanding.             Doree Albee, MD  12/31/12 0945 

## 2012-12-31 NOTE — ED Notes (Signed)
Freedom c/o pain, redness in and swelling in the nail bed of his right great toe x yesterday. This problem has occurred before in his left great toe. No hx of gout. Used antibiotic ointment.

## 2013-05-12 ENCOUNTER — Other Ambulatory Visit: Payer: Self-pay | Admitting: Cardiovascular Disease

## 2013-05-13 NOTE — Telephone Encounter (Signed)
Rx was sent to pharmacy electronically. 

## 2013-06-06 ENCOUNTER — Telehealth (HOSPITAL_COMMUNITY): Payer: Self-pay | Admitting: *Deleted

## 2013-06-13 ENCOUNTER — Telehealth (HOSPITAL_COMMUNITY): Payer: Self-pay | Admitting: *Deleted

## 2013-06-20 ENCOUNTER — Telehealth (HOSPITAL_COMMUNITY): Payer: Self-pay | Admitting: *Deleted

## 2013-06-23 ENCOUNTER — Telehealth (HOSPITAL_COMMUNITY): Payer: Self-pay | Admitting: *Deleted

## 2013-07-13 ENCOUNTER — Encounter: Payer: Self-pay | Admitting: Cardiovascular Disease

## 2013-07-13 ENCOUNTER — Ambulatory Visit (INDEPENDENT_AMBULATORY_CARE_PROVIDER_SITE_OTHER): Payer: BC Managed Care – PPO | Admitting: Cardiovascular Disease

## 2013-07-13 VITALS — BP 130/90 | HR 48 | Ht 75.0 in | Wt 251.1 lb

## 2013-07-13 DIAGNOSIS — R5383 Other fatigue: Secondary | ICD-10-CM

## 2013-07-13 DIAGNOSIS — I2510 Atherosclerotic heart disease of native coronary artery without angina pectoris: Secondary | ICD-10-CM | POA: Insufficient documentation

## 2013-07-13 DIAGNOSIS — I10 Essential (primary) hypertension: Secondary | ICD-10-CM | POA: Insufficient documentation

## 2013-07-13 DIAGNOSIS — R5381 Other malaise: Secondary | ICD-10-CM

## 2013-07-13 DIAGNOSIS — E785 Hyperlipidemia, unspecified: Secondary | ICD-10-CM | POA: Insufficient documentation

## 2013-07-13 DIAGNOSIS — Z79899 Other long term (current) drug therapy: Secondary | ICD-10-CM

## 2013-07-13 LAB — CBC
Hemoglobin: 16.4 g/dL (ref 13.0–17.0)
MCH: 31.5 pg (ref 26.0–34.0)
MCV: 90.2 fL (ref 78.0–100.0)
RBC: 5.21 MIL/uL (ref 4.22–5.81)

## 2013-07-13 LAB — COMPREHENSIVE METABOLIC PANEL
ALT: 13 U/L (ref 0–53)
BUN: 12 mg/dL (ref 6–23)
CO2: 25 mEq/L (ref 19–32)
Calcium: 9.2 mg/dL (ref 8.4–10.5)
Chloride: 105 mEq/L (ref 96–112)
Creat: 0.78 mg/dL (ref 0.50–1.35)

## 2013-07-13 LAB — LIPID PANEL: Cholesterol: 241 mg/dL — ABNORMAL HIGH (ref 0–200)

## 2013-07-13 LAB — TSH: TSH: 1.547 u[IU]/mL (ref 0.350–4.500)

## 2013-07-13 MED ORDER — EZETIMIBE 10 MG PO TABS: 10.0000 mg | ORAL_TABLET | Freq: Every day | ORAL | Status: DC

## 2013-07-13 MED ORDER — METOPROLOL SUCCINATE ER 25 MG PO TB24: ORAL_TABLET | ORAL | Status: DC

## 2013-07-13 NOTE — Assessment & Plan Note (Signed)
Borderline controlled on current medications 

## 2013-07-13 NOTE — Patient Instructions (Signed)
  We will see you back in follow up in 1 year with Dr Berry  Dr Berry has ordered blood work to be done fasting.    

## 2013-07-13 NOTE — Progress Notes (Signed)
07/13/2013 Harold Garner   [birth year]  [id number]  Primary Physician Harold Espy, MD Primary Cardiologist: Harold Gess MD Harold Garner   HPI:  The patient returns today for annual followup. He is a 62 year old moderately overweight married Caucasian male with no children who I last saw a year ago. He has a history of CAD status post coronary artery bypass grafting in October of 2008 by Dr. Evelene Garner. He had a LIMA to his LAD, a vein to a 1st and 2nd marginal branch sequentially, and a vein to the PDA. His EF was 35% at that time with followup echo revealing normalization of his ejection fraction 12/08. He also has hyperlipidemia, intolerant to statin drugs. He denies chest pain or shortness of breath.     Current Outpatient Prescriptions  Medication Sig Dispense Refill  . aspirin 81 MG tablet Take 81 mg by mouth daily.      . Coenzyme Q10 (COQ10) 100 MG CAPS Take by mouth.      . dorzolamide-timolol (COSOPT) 22.3-6.8 MG/ML ophthalmic solution 1 drop 2 (two) times daily.      . metoprolol succinate (TOPROL-XL) 25 MG 24 hr tablet TAKE ONE TABLET BY MOUTH ONE TIME DAILY  30 tablet  2  . Multiple Vitamin (MULTIVITAMIN) tablet Take 1 tablet by mouth daily.       No current facility-administered medications for this visit.    Allergies  Allergen Reactions  . Darvon [Propoxyphene Hcl]     History   Social History  . Marital Status: Married    Spouse Name: N/A    Number of Children: N/A  . Years of Education: N/A   Occupational History  . Not on file.   Social History Main Topics  . Smoking status: Light Tobacco Smoker    Types: Cigars  . Smokeless tobacco: Never Used     Comment: chew on them mostly  . Alcohol Use: Yes  . Drug Use: No  . Sexual Activity: Not on file   Other Topics Concern  . Not on file   Social History Narrative  . No narrative on file     Review of Systems: General: negative for chills, fever, night sweats or weight  changes.  Cardiovascular: negative for chest pain, dyspnea on exertion, edema, orthopnea, palpitations, paroxysmal nocturnal dyspnea or shortness of breath Dermatological: negative for rash Respiratory: negative for cough or wheezing Urologic: negative for hematuria Abdominal: negative for nausea, vomiting, diarrhea, bright red blood per rectum, melena, or hematemesis Neurologic: negative for visual changes, syncope, or dizziness All other systems reviewed and are otherwise negative except as noted above.    Blood pressure 130/90, pulse 48, height 6\' 3"  (1.905 m), weight 251 lb 1.6 oz (113.898 kg).  General appearance: alert and no distress Neck: no adenopathy, no carotid bruit, no JVD, supple, symmetrical, trachea midline and thyroid not enlarged, symmetric, no tenderness/mass/nodules Lungs: clear to auscultation bilaterally Heart: regular rate and rhythm, S1, S2 normal, no murmur, click, rub or gallop Extremities: extremities normal, atraumatic, no cyanosis or edema  EKG sinus bradycardia at 48 without ST or T wave changes  ASSESSMENT AND PLAN:   Coronary artery disease Status post coronary artery bypass grafting October of 2008 by Dr. Rexanne Garner . He had a LIMA to his LAD, a vein to the first and second marginal branches sequentially I think the PDA.  His initial ejection fraction was 35% which had improved by 2-D echo 12/82 normal. He denies chest pain or  shortness of breath.his left Myoview stress test performed 06/25/11 was low risk and nonischemic  Essential hypertension Borderline controlled on current medications  Hyperlipidemia Intolerant to statin therapy. We will check a lipid and liver profile.      Harold Gess MD FACP,FACC,FAHA, [facility] 07/13/2013 9:43 AM

## 2013-07-13 NOTE — Assessment & Plan Note (Signed)
Intolerant to statin therapy. We will check a lipid and liver profile.

## 2013-07-13 NOTE — Assessment & Plan Note (Signed)
Status post coronary artery bypass grafting October of 2008 by Dr. Rexanne Mano . He had a LIMA to his LAD, a vein to the first and second marginal branches sequentially I think the PDA.  His initial ejection fraction was 35% which had improved by 2-D echo 12/82 normal. He denies chest pain or shortness of breath.his left Myoview stress test performed 06/25/11 was low risk and nonischemic

## 2013-07-22 ENCOUNTER — Telehealth: Payer: Self-pay | Admitting: Cardiovascular Disease

## 2013-07-22 NOTE — Telephone Encounter (Signed)
Message forwarded to Kathryn, RN to advise 

## 2013-07-22 NOTE — Telephone Encounter (Signed)
Patient would like his lab results---had these drawn a couple of weeks ago.

## 2013-07-28 ENCOUNTER — Encounter: Payer: Self-pay | Admitting: *Deleted

## 2013-07-28 NOTE — Telephone Encounter (Signed)
i spoke with patient and reviewed the lab work results

## 2013-10-20 ENCOUNTER — Other Ambulatory Visit: Payer: Self-pay | Admitting: Urology

## 2013-10-20 ENCOUNTER — Encounter (HOSPITAL_COMMUNITY): Payer: Self-pay | Admitting: *Deleted

## 2013-10-21 ENCOUNTER — Encounter (HOSPITAL_COMMUNITY): Payer: Self-pay | Admitting: Pharmacy Technician

## 2013-10-24 ENCOUNTER — Encounter (HOSPITAL_COMMUNITY): Payer: Self-pay | Admitting: General Practice

## 2013-10-24 ENCOUNTER — Encounter (HOSPITAL_COMMUNITY)
Admission: RE | Disposition: A | Discharge status: Home / Self Care | Payer: Self-pay | Source: Ambulatory Visit | Attending: Urology

## 2013-10-24 ENCOUNTER — Ambulatory Visit (HOSPITAL_COMMUNITY)
Admission: RE | Admit: 2013-10-24 | Discharge: 2013-10-24 | Disposition: A | Discharge status: Home / Self Care | Payer: BC Managed Care – PPO | Source: Ambulatory Visit | Attending: Urology | Admitting: Urology

## 2013-10-24 ENCOUNTER — Ambulatory Visit (HOSPITAL_COMMUNITY): Payer: BC Managed Care – PPO

## 2013-10-24 DIAGNOSIS — I10 Essential (primary) hypertension: Secondary | ICD-10-CM | POA: Insufficient documentation

## 2013-10-24 DIAGNOSIS — N201 Calculus of ureter: Secondary | ICD-10-CM

## 2013-10-24 DIAGNOSIS — N529 Male erectile dysfunction, unspecified: Secondary | ICD-10-CM | POA: Insufficient documentation

## 2013-10-24 DIAGNOSIS — N200 Calculus of kidney: Secondary | ICD-10-CM | POA: Insufficient documentation

## 2013-10-24 DIAGNOSIS — I499 Cardiac arrhythmia, unspecified: Secondary | ICD-10-CM | POA: Insufficient documentation

## 2013-10-24 DIAGNOSIS — E7800 Pure hypercholesterolemia, unspecified: Secondary | ICD-10-CM | POA: Insufficient documentation

## 2013-10-24 DIAGNOSIS — H409 Unspecified glaucoma: Secondary | ICD-10-CM | POA: Insufficient documentation

## 2013-10-24 DIAGNOSIS — I252 Old myocardial infarction: Secondary | ICD-10-CM | POA: Insufficient documentation

## 2013-10-24 DIAGNOSIS — Z79899 Other long term (current) drug therapy: Secondary | ICD-10-CM | POA: Insufficient documentation

## 2013-10-24 DIAGNOSIS — Z87891 Personal history of nicotine dependence: Secondary | ICD-10-CM | POA: Insufficient documentation

## 2013-10-24 DIAGNOSIS — Z7982 Long term (current) use of aspirin: Secondary | ICD-10-CM | POA: Insufficient documentation

## 2013-10-24 HISTORY — DX: Acute myocardial infarction, unspecified: I21.9

## 2013-10-24 HISTORY — DX: Gastro-esophageal reflux disease without esophagitis: K21.9

## 2013-10-24 SURGERY — LITHOTRIPSY, ESWL
Anesthesia: LOCAL | Laterality: Left

## 2013-10-24 MED ORDER — CIPROFLOXACIN HCL 500 MG PO TABS
500.0000 mg | ORAL_TABLET | ORAL | Status: AC
  Administered 2013-10-24: 500 mg via ORAL
  Filled 2013-10-24: qty 1

## 2013-10-24 MED ORDER — SODIUM CHLORIDE 0.9 % IV SOLN
INTRAVENOUS | Status: DC
  Administered 2013-10-24: 07:00:00 via INTRAVENOUS

## 2013-10-24 MED ORDER — DIAZEPAM 5 MG PO TABS
10.0000 mg | ORAL_TABLET | ORAL | Status: AC
  Administered 2013-10-24: 10 mg via ORAL
  Filled 2013-10-24: qty 2

## 2013-10-24 MED ORDER — OXYCODONE HCL 10 MG PO TABS: 10.0000 mg | ORAL_TABLET | ORAL | Status: DC | PRN

## 2013-10-24 MED ORDER — TAMSULOSIN HCL 0.4 MG PO CAPS: 0.4000 mg | ORAL_CAPSULE | ORAL | Status: DC

## 2013-10-24 MED ORDER — DIPHENHYDRAMINE HCL 25 MG PO CAPS
25.0000 mg | ORAL_CAPSULE | ORAL | Status: AC
  Administered 2013-10-24: 25 mg via ORAL
  Filled 2013-10-24: qty 1

## 2013-10-24 NOTE — Op Note (Signed)
See [facility] OP note scanned into chart. 

## 2013-10-24 NOTE — Discharge Instructions (Signed)
Lithotripsy, Care After °Refer to this sheet in the next few weeks. These instructions provide you with information on caring for yourself after your procedure. Your health care provider may also give you more specific instructions. Your treatment has been planned according to current medical practices, but problems sometimes occur. Call your health care provider if you have any problems or questions after your procedure. °WHAT TO EXPECT AFTER THE PROCEDURE  °· Your urine may have a red tinge for a few days after treatment. Blood loss is usually minimal. °· You may have soreness in the back or flank area. This usually goes away after a few days. The procedure can cause blotches or bruises on the back where the pressure wave enters the skin. These marks usually cause only minimal discomfort and should disappear in a short time. °· Stone fragments should begin to pass within 24 hours of treatment. However, a delayed passage is not unusual. °· You may have pain, discomfort, and feel sick to your stomach (nauseated) when the crushed fragments of stone are passed down the tube from the kidney to the bladder. Stone fragments can pass soon after the procedure and may last for up to 4 8 weeks. °· A small number of patients may have severe pain when stone fragments are not able to pass, which leads to an obstruction. °· If your stone is greater than 1 inch (2.5 cm) in diameter or if you have multiple stones that have a combined diameter greater than 1 inch (2.5 cm), you may require more than one treatment. °· If you had a stent placed prior to your procedure, you may experience some discomfort, especially during urination. You may experience the pain or discomfort in your flank or back, or you may experience a sharp pain or discomfort at the base of your penis or in your lower abdomen. The discomfort usually lasts only a few minutes after urinating. °HOME CARE INSTRUCTIONS  °· Rest at home until you feel your energy  improving. °· Only take over-the-counter or prescription medicines for pain, discomfort, or fever as directed by your health care provider. Depending on the type of lithotripsy, you may need to take antibiotics and anti-inflammatory medicines for a few days. °· Drink enough water and fluids to keep your urine clear or pale yellow. This helps "flush" your kidneys. It helps pass any remaining pieces of stone and prevents stones from coming back. °· Most people can resume daily activities within 1 2 days after standard lithotripsy. It can take longer to recover from laser and percutaneous lithotripsy. °· If the stones are in your urinary system, you may be asked to strain your urine at home to look for stones. Any stones that are found can be sent to a medical lab for examination. °· Visit your health care provider for a follow-up appointment in a few weeks. Your doctor may remove your stent if you have one. Your health care provider will also check to see whether stone particles still remain. °SEEK MEDICAL CARE IF:  °· Your pain is not relieved by medicine. °· You have a lasting nauseous feeling. °· You feel there is too much blood in the urine. °· You develop persistent problems with frequent or painful urination that does not at least partially improve after 2 days following the procedure. °· You have a congested cough. °· You feel lightheaded. °· You develop a rash or any other signs that might suggest an allergic problem. °· You develop any reaction or side   effects to your medicine(s). °SEEK IMMEDIATE MEDICAL CARE IF:  °· You experience severe back or flank pain or both. °· You see nothing but blood when you urinate. °· You cannot pass any urine at all. °· You have a fever or shaking chills. °· You develop shortness of breath, difficulty breathing, or chest pain. °· You develop vomiting that will not stop after 6 8 hours. °· You have a fainting episode. °Document Released: 09/28/2007 Document Revised: 06/29/2013  Document Reviewed: 03/24/2013 °ExitCare® Patient Information ©2014 [employer]. ° °

## 2013-10-24 NOTE — H&P (Signed)
Harold Garner is a 63 year old male seen in follow-up for a left ureteral stone.   History of Present Illness Nephrolithiasis: He has passed several stones previously. He underwent lithotripsy first in 10/91. He then underwent lithotripsy for a left ureteral stone in 1/05. CT scan in 1/05 revealed multiple, small, bilateral renal calculi. A CT scan in 6/13 again revealed bilateral small renal calculi.  Stone analysis: 90% calcium oxalate and 10% calcium phosphate    Erectile dysfunction: This has been managed in the past with PDE 5 inhibitor therapy and MUSE.  Current therapy Cialis 5 mg per daily use        Interval history: He developed left flank pain and a CT scan 09/27/13 revealed no right renal calculi and a single left renal stone. There was a 6 mm left mid ureteral stone with proximal ureteral dilatation. It has Hounsfield units of 800. He was placed on medical expulsive therapy. He has not had any further flank pain and has not had any hematuria but he has not seen his stone pass.   Past Medical History Problems  1. History of Acute Myocardial Infarction (V12.59) 2. 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 History of Calculus of distal right ureter (592.1) 4. History of Calculus of right ureter (592.1) 5. History of glaucoma (V12.49) 6. History of heartburn (V12.79) 7. History of hypercholesterolemia (V12.29) 8. History of hypertension (V12.59) 9. History of kidney stones (V13.01) 10. History of Hydronephrosis, right (591) 11. 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 History of Heart Surgery  Current Meds 1. Aspirin Low Dose 81 MG Oral Tablet Delayed Release;  Therapy: (Recorded:22Apr2010) to Recorded 2. Co Q 10 CAPS;  Therapy: (Recorded:22Apr2010) to Recorded 3. Metoprolol Tartrate 25 MG Oral Tablet;  Therapy: (Recorded:22Apr2010) to Recorded 4. Ondansetron HCl - 8 MG Oral Tablet; TAKE 1 TABLET 3 times daily PRN  nausea;  Therapy: [id number] to (Evaluate:23Dec2014)  Requested for: [id number]; Last  Rx:16Dec2014 Ordered 5. Oxycodone-Acetaminophen 5-325 MG Oral Tablet; take 1 or 2 tablets q 4-6 hours prn  pain;  Therapy: [id number] to (Last Rx:06Jan2015) Ordered 6. Promethazine HCl - 25 MG Oral Tablet; TAKE 1 TABLET EVERY 6 HOURS AS NEEDED  FOR NAUSEA;  Therapy: [id number] to (Evaluate:11Jan2015)  Requested for: [id number]; Last  Rx:06Jan2015 Ordered 7. Zetia 10 MG Oral Tablet;  Therapy: (Recorded:16Jun2011) to Recorded  Allergies Medication  1. Darvon CAPS  Family History Problems  1. Family history of Acute Myocardial Infarction (V17.3) : Mother 2. Family history of Family Health Status - Father's Age : Father   Deceased-lung caner 3. Family history of Family Health Status - Mother's Age : Father   deceased-heart attack 4. Family history of Lung Cancer (V16.1) : Father  Social History Problems  1. Alcohol Use   1 per day 2. Caffeine Use   1 per day 3. Current some day smoker (305.1) 4. Marital History - Currently Married 5. Occupation:   Water engineer 6. Tobacco Use (V15.82)   1 cigar for 25 years  Vitals Vital Signs  Height: 6 ft 3 in Weight: 240 lb  BMI Calculated: 30 BSA Calculated: 2.37 Blood Pressure: 129 / 79 Heart Rate: 50  Review of Systems Genitourinary, constitutional, skin, eye, otolaryngeal, hematologic/lymphatic, cardiovascular, pulmonary, endocrine, musculoskeletal, gastrointestinal, neurological and psychiatric system(s) were reviewed and pertinent findings if present are noted.  Genitourinary: incontinence, difficulty starting the urinary stream, weak urinary stream and erectile dysfunction.  ENT: sinus problems.  Cardiovascular: leg swelling.  Musculoskeletal: back  pain.     Physical Exam Constitutional: Well nourished and well developed. No acute distress.  ENT:. The ears and nose are normal in appearance.  Neck: The appearance of the neck is  normal and no neck mass is present.  Pulmonary: No respiratory distress and normal respiratory rhythm and effort.  Cardiovascular: Heart rate and rhythm are normal. No peripheral edema.  Abdomen: The abdomen is soft and nontender. No masses are palpated. No CVA tenderness. No hernias are palpable. No hepatosplenomegaly noted.  Rectal: Rectal exam demonstrates normal sphincter tone, no tenderness and no masses. Estimated prostate size is 1+. The prostate has no nodularity and is not tender. The left seminal vesicle is nonpalpable. The right seminal vesicle is nonpalpable. The perineum is normal on inspection.  Genitourinary: Examination of the penis demonstrates no discharge, no masses, no lesions and a normal meatus. The scrotum is without lesions. The right epididymis is palpably normal and non-tender. The left epididymis is palpably normal and non-tender. The right testis is non-tender and without masses. The left testis is non-tender and without masses.  Lymphatics: The femoral and inguinal nodes are not enlarged or tender.  Skin: Normal skin turgor, no visible rash and no visible skin lesions.  Neuro/Psych:. Mood and affect are appropriate.    Results/Data Urine  COLOR YELLOW  APPEARANCE CLEAR  SPECIFIC GRAVITY 1.015  pH 6.5  GLUCOSE NEG mg/dL BILIRUBIN NEG  KETONE NEG mg/dL BLOOD NEG  PROTEIN NEG mg/dL UROBILINOGEN 0.2 mg/dL NITRITE NEG  LEUKOCYTE ESTERASE NEG   The following images/tracing/specimen were independently visualized:  KUB: I can still see a calcification located in the same position as it was seen on his CT scan at the L4-L5 interspace level on the left-hand side.  The following clinical lab reports were reviewed:  His urine was clear today.    Assessment Assessed  1. Renal calculus, left (592.0) 2. Calculus of left ureter (592.1)  We discussed the management of urinary stones. These options include observation, ureteroscopy, shockwave lithotripsy, and PCNL. We  discussed which options are relevant to these particular stones. We discussed the natural history of stones as well as the complications of untreated stones and the impact on quality of life without treatment as well as with each of the above listed treatments. We also discussed the efficacy of each treatment in its ability to clear the stone burden. With any of these management options I discussed the signs and symptoms of infection and the need for emergent treatment should these be experienced. For each option we discussed the ability of each procedure to clear the patient of their stone burden.    For observation I described the risks which include but are not limited to silent renal damage, life-threatening infection, need for emergent surgery, failure to pass stone, and pain.    For ureteroscopy I described the risks which include heart attack, stroke, pulmonary embolus, death, bleeding, infection, damage to contiguous structures, positioning injury, ureteral stricture, ureteral avulsion, ureteral injury, need for ureteral stent, inability to perform ureteroscopy, need for an interval procedure, inability to clear stone burden, stent discomfort and pain.    For shockwave lithotripsy I described the risks which include arrhythmia, kidney contusion, kidney hemorrhage, need for transfusion, long-term risk of diabetes or hypertension, back discomfort, flank ecchymosis, flank abrasion, inability to break up stone, inability to pass stone fragments, Steinstrasse, infection associated with obstructing stones, need for different surgical procedure, need for repeat shockwave lithotripsy, and death.    He has undergone both  of these procedures in the past and has elected to proceed with lithotripsy.   Plan Calculus of left ureter    He is scheduled for lithotripsy of his left mid ureteral stone.

## 2014-03-11 ENCOUNTER — Other Ambulatory Visit: Payer: Self-pay | Admitting: Cardiovascular Disease

## 2014-03-13 ENCOUNTER — Other Ambulatory Visit: Payer: Self-pay

## 2014-03-13 NOTE — Telephone Encounter (Signed)
Rx was sent to pharmacy electronically. 

## 2014-07-10 ENCOUNTER — Other Ambulatory Visit: Payer: Self-pay | Admitting: Cardiovascular Disease

## 2014-07-11 NOTE — Telephone Encounter (Signed)
Rx was sent to pharmacy electronically. 

## 2014-08-07 ENCOUNTER — Other Ambulatory Visit: Payer: Self-pay | Admitting: Cardiovascular Disease

## 2014-08-22 ENCOUNTER — Telehealth: Payer: Self-pay | Admitting: Cardiovascular Disease

## 2014-08-22 DIAGNOSIS — Z79899 Other long term (current) drug therapy: Secondary | ICD-10-CM

## 2014-08-22 DIAGNOSIS — E785 Hyperlipidemia, unspecified: Secondary | ICD-10-CM

## 2014-08-22 NOTE — Telephone Encounter (Signed)
Pt needs labs order placed so he can come in prior to his appt to have them done. Please let him know once those are placed.  Thanks

## 2014-08-22 NOTE — Telephone Encounter (Signed)
Left message will mail lab slip--CMP, LIPID.

## 2014-08-24 ENCOUNTER — Other Ambulatory Visit: Payer: Self-pay | Admitting: Cardiovascular Disease

## 2014-08-25 NOTE — Telephone Encounter (Signed)
Rx was sent to pharmacy electronically. OV 12/29

## 2014-09-06 LAB — COMPREHENSIVE METABOLIC PANEL
ALT: 10 U/L (ref 0–53)
AST: 14 U/L (ref 0–37)
Albumin: 3.8 g/dL (ref 3.5–5.2)
Alkaline Phosphatase: 74 U/L (ref 39–117)
BUN: 12 mg/dL (ref 6–23)
CALCIUM: 8.8 mg/dL (ref 8.4–10.5)
CHLORIDE: 105 meq/L (ref 96–112)
CO2: 24 mEq/L (ref 19–32)
CREATININE: 0.76 mg/dL (ref 0.50–1.35)
GLUCOSE: 102 mg/dL — AB (ref 70–99)
Potassium: 4.3 mEq/L (ref 3.5–5.3)
Sodium: 139 mEq/L (ref 135–145)
Total Bilirubin: 0.6 mg/dL (ref 0.2–1.2)
Total Protein: 6.2 g/dL (ref 6.0–8.3)

## 2014-09-06 LAB — LIPID PANEL
Cholesterol: 229 mg/dL — ABNORMAL HIGH (ref 0–200)
HDL: 43 mg/dL
LDL Cholesterol: 152 mg/dL — ABNORMAL HIGH (ref 0–99)
Total CHOL/HDL Ratio: 5.3 ratio
Triglycerides: 169 mg/dL — ABNORMAL HIGH
VLDL: 34 mg/dL (ref 0–40)

## 2014-09-12 ENCOUNTER — Encounter: Payer: Self-pay | Admitting: *Deleted

## 2014-09-19 ENCOUNTER — Ambulatory Visit: Payer: BC Managed Care – PPO | Admitting: Cardiovascular Disease

## 2014-09-24 ENCOUNTER — Other Ambulatory Visit: Payer: Self-pay | Admitting: Cardiovascular Disease

## 2014-09-25 NOTE — Telephone Encounter (Signed)
Rx(s) sent to pharmacy electronically. OV 10/27/14

## 2014-10-27 ENCOUNTER — Ambulatory Visit (INDEPENDENT_AMBULATORY_CARE_PROVIDER_SITE_OTHER): Payer: Self-pay | Admitting: Cardiovascular Disease

## 2014-10-27 ENCOUNTER — Encounter: Payer: Self-pay | Admitting: Cardiovascular Disease

## 2014-10-27 ENCOUNTER — Encounter: Payer: Self-pay | Admitting: *Deleted

## 2014-10-27 VITALS — BP 120/84 | HR 50 | Ht 75.0 in | Wt 256.8 lb

## 2014-10-27 DIAGNOSIS — I2510 Atherosclerotic heart disease of native coronary artery without angina pectoris: Secondary | ICD-10-CM

## 2014-10-27 DIAGNOSIS — E785 Hyperlipidemia, unspecified: Secondary | ICD-10-CM

## 2014-10-27 DIAGNOSIS — I10 Essential (primary) hypertension: Secondary | ICD-10-CM

## 2014-10-27 MED ORDER — ROSUVASTATIN CALCIUM 5 MG PO TABS
5.0000 mg | ORAL_TABLET | ORAL | Status: DC
Start: 2014-10-27 — End: 2015-02-01

## 2014-10-27 MED ORDER — EZETIMIBE 10 MG PO TABS: 10.0000 mg | ORAL_TABLET | Freq: Every day | ORAL | Status: DC

## 2014-10-27 MED ORDER — METOPROLOL SUCCINATE ER 25 MG PO TB24: 25.0000 mg | ORAL_TABLET | Freq: Every day | ORAL | Status: DC

## 2014-10-27 NOTE — Assessment & Plan Note (Signed)
History of hypertension blood pressure measured at 120/84 on metoprolol succinate I 25 mg a day. Continue current meds at current dosing

## 2014-10-27 NOTE — Patient Instructions (Addendum)
Your physician wants you to follow-up in: 1 Year You will receive a reminder letter in the mail two months in advance. If you don't receive a letter, please call our office to schedule the follow-up appointment.  Start Crestor 5 mg - 3 times a week Start Zetia 10 mg - daily  Call Tommy Medal, PharmD; [phone] ext [phone] in about 2 weeks to tell her how you are tolerating the Crestor

## 2014-10-27 NOTE — Progress Notes (Signed)
10/27/2014 Harold Garner   Jul 19, 1951  [id number]  Primary Physician Harold Smolder, MD Primary Cardiologist: Harold Harp MD Harold Garner   HPI:  The patient returns today for annual followup. He is a 64 year old moderately overweight married Caucasian male with no children who I last saw a year ago. He has a history of CAD status post coronary artery bypass grafting in October of 2008 by Dr. Gilford Garner. He had a LIMA to his LAD, a vein to a 1st and 2nd marginal branch sequentially, and a vein to the PDA. His EF was 35% at that time with followup echo revealing normalization of his ejection fraction 12/08. He also has hyperlipidemia, intolerant to statin drugs. He denies chest pain or shortness of breath.   Current Outpatient Prescriptions  Medication Sig Dispense Refill  . aspirin 81 MG tablet Take 81 mg by mouth every morning.     . Coenzyme Q10 (COQ10) 100 MG CAPS Take 100 mg by mouth.     . dorzolamide-timolol (COSOPT) 22.3-6.8 MG/ML ophthalmic solution Place 1 drop into both eyes 2 (two) times daily.     . metoprolol succinate (TOPROL-XL) 25 MG 24 hr tablet Take 1 tablet (25 mg total) by mouth daily. <Appointment 10/27/2014> 30 tablet 1  . Multiple Vitamin (MULTIVITAMIN) tablet Take 1 tablet by mouth daily.     No current facility-administered medications for this visit.    Allergies  Allergen Reactions  . Darvon [Propoxyphene Hcl] Itching    History   Social History  . Marital Status: Married    Spouse Name: N/A    Number of Children: N/A  . Years of Education: N/A   Occupational History  . Not on file.   Social History Main Topics  . Smoking status: Light Tobacco Smoker    Types: Cigars  . Smokeless tobacco: Never Used     Comment: chew on them mostly  . Alcohol Use: Yes  . Drug Use: No  . Sexual Activity: Not on file   Other Topics Concern  . Not on file   Social History Narrative     Review of Systems: General: negative for  chills, fever, night sweats or weight changes.  Cardiovascular: negative for chest pain, dyspnea on exertion, edema, orthopnea, palpitations, paroxysmal nocturnal dyspnea or shortness of breath Dermatological: negative for rash Respiratory: negative for cough or wheezing Urologic: negative for hematuria Abdominal: negative for nausea, vomiting, diarrhea, bright red blood per rectum, melena, or hematemesis Neurologic: negative for visual changes, syncope, or dizziness All other systems reviewed and are otherwise negative except as noted above.    Blood pressure 120/84, pulse 50, height 6\' 3"  (1.905 m), weight 256 lb 12.8 oz (116.484 kg).  General appearance: alert and no distress Neck: no adenopathy, no carotid bruit, no JVD, supple, symmetrical, trachea midline and thyroid not enlarged, symmetric, no tenderness/mass/nodules Lungs: clear to auscultation bilaterally Heart: regular rate and rhythm, S1, S2 normal, no murmur, click, rub or gallop Extremities: extremities normal, atraumatic, no cyanosis or edema  EKG sinus bradycardia at 50 without ST or T-wave changes. I personally reviewed this EKG  ASSESSMENT AND PLAN:   Hyperlipidemia History of hyperlipidemia intolerant to statin drugs. This measures a lipid profile performed 09/05/14 revealed a total cholesterol 229, LDL 152 HDL of 43. I'm going to explore the possibility of treatment with PCSK9 monoclonal injectables.   Essential hypertension History of hypertension blood pressure measured at 120/84 on metoprolol succinate I 25 mg a day. Continue current  meds at current dosing   Coronary artery disease History of CAD status post coronary artery bypass grafting October 2008 by Dr. Arvid Garner with a LIMA to the LAD, vein to the first and second marginal branches sequentially and a vein to the PDA. His ejection fraction was 35% at that time with follow-up echo revealing normalization of this. He denies chest pain or shortness of  breath.       Harold Harp MD FACP,FACC,FAHA, [facility] 10/27/2014 12:56 PM

## 2014-10-27 NOTE — Assessment & Plan Note (Signed)
History of CAD status post coronary artery bypass grafting October 2008 by Dr. Arvid Right with a LIMA to the LAD, vein to the first and second marginal branches sequentially and a vein to the PDA. His ejection fraction was 35% at that time with follow-up echo revealing normalization of this. He denies chest pain or shortness of breath.

## 2014-10-27 NOTE — Assessment & Plan Note (Signed)
History of hyperlipidemia intolerant to statin drugs. This measures a lipid profile performed 09/05/14 revealed a total cholesterol 229, LDL 152 HDL of 43. I'm going to explore the possibility of treatment with PCSK9 monoclonal injectables.

## 2014-11-02 ENCOUNTER — Telehealth: Payer: Self-pay | Admitting: *Deleted

## 2014-11-02 NOTE — Telephone Encounter (Signed)
PA done through cover my meds for Crestor.  Med approved!  Patient has tried and failed pravachol and lipitor.  He is statin intolerant.  Approval faxed to [facility]

## 2014-11-21 ENCOUNTER — Telehealth: Payer: Self-pay | Admitting: Pharmacist Clinician (PhC)/ Clinical Pharmacy Specialist

## 2014-11-21 NOTE — Telephone Encounter (Signed)
Spoke with patient about ability to tolerate Crestor 5 mg tiw.  Pt states has had increasing muscle aches and pains.  Advised that he discontinue for 10 days, then restart at just once weekly.  Call if unable to tolerate that after 2+ doses.  Pt voiced understanding

## 2014-12-28 ENCOUNTER — Other Ambulatory Visit: Payer: Self-pay | Admitting: Family Medicine

## 2014-12-28 DIAGNOSIS — Z139 Encounter for screening, unspecified: Secondary | ICD-10-CM

## 2015-01-02 ENCOUNTER — Telehealth: Payer: Self-pay | Admitting: Pharmacist Clinician (PhC)/ Clinical Pharmacy Specialist

## 2015-01-02 DIAGNOSIS — E785 Hyperlipidemia, unspecified: Secondary | ICD-10-CM

## 2015-01-02 NOTE — Telephone Encounter (Signed)
Spoke with patient, he was not able to tolerate Crestor at once weekly dose.  Caused him to have myalgias.  Currently takes Zetia 10 mg daily.  Will mail lab orders to patient, he can have drawn in next week or two, then see me in lipid clinic on May 6.  Will determine if PCSK-9 eligible.

## 2015-01-23 ENCOUNTER — Other Ambulatory Visit: Payer: Self-pay | Admitting: Cardiovascular Disease

## 2015-01-24 LAB — HEPATIC FUNCTION PANEL
ALT: 13 U/L (ref 0–53)
AST: 16 U/L (ref 0–37)
Albumin: 3.6 g/dL (ref 3.5–5.2)
Alkaline Phosphatase: 73 U/L (ref 39–117)
Bilirubin, Direct: 0.1 mg/dL (ref 0.0–0.3)
Indirect Bilirubin: 0.5 mg/dL (ref 0.2–1.2)
Total Bilirubin: 0.6 mg/dL (ref 0.2–1.2)
Total Protein: 6 g/dL (ref 6.0–8.3)

## 2015-01-24 LAB — LIPID PANEL
Cholesterol: 182 mg/dL (ref 0–200)
HDL: 36 mg/dL — ABNORMAL LOW (ref >=40)
LDL Cholesterol: 121 mg/dL — ABNORMAL HIGH (ref 0–99)
Total CHOL/HDL Ratio: 5.1 Ratio
Triglycerides: 127 mg/dL (ref <150)
VLDL: 25 mg/dL (ref 0–40)

## 2015-01-26 ENCOUNTER — Ambulatory Visit: Payer: BLUE CROSS/BLUE SHIELD | Admitting: Pharmacist Clinician (PhC)/ Clinical Pharmacy Specialist

## 2015-02-01 ENCOUNTER — Encounter: Payer: Self-pay | Admitting: Pharmacist Clinician (PhC)/ Clinical Pharmacy Specialist

## 2015-02-01 ENCOUNTER — Ambulatory Visit (INDEPENDENT_AMBULATORY_CARE_PROVIDER_SITE_OTHER): Payer: Medicare HMO | Admitting: Pharmacist Clinician (PhC)/ Clinical Pharmacy Specialist

## 2015-02-01 VITALS — Ht 75.0 in | Wt 254.9 lb

## 2015-02-01 DIAGNOSIS — E785 Hyperlipidemia, unspecified: Secondary | ICD-10-CM | POA: Diagnosis not present

## 2015-02-01 NOTE — Patient Instructions (Signed)
Continue with your Zetia 10 mg once daily.    We will start the paperwork for [place].  It is an injection that you give yourself twice weekly.  If hear anything from your insurance company about denying this medication, please bring that paperwork by the office for [place].

## 2015-02-02 NOTE — Progress Notes (Signed)
02/02/2015 Harold Garner [birth year] [id number]   HPI:  Harold Garner is a 64 y.o. male patient of Dr. Gwenlyn Found, who presents today for a lipid clinic evaluation.  RF:  ASCVD- MI with CABG x 3 in 2008  Meds: ezetimibe 10 mg qd   Tried in the past:   Atorvastatin - chart notes from initial visit in 2008 indicate unable to tolerate  Rosuvastatin 5 mg (Oct 2008-Jan 2009)  Pravastatin 20 mg (Feb 2009-Mar 2009)  Simvastatin 20 mg (July 2010)  Rosuvastatin 5 mg three times weekly - down to once weekly (Feb 2019 - Mar 2016)  Family history: mother died from MI at age 40  Diet:  eats cereals.oatmeal for breakfast, with occasional biscuit; avoids fried foods, but does eat high carbs (breads, potatoes)  Exercise: no organized exercise, does yard work, keeps active    Labs:  Results for CARLOSDANIEL, GROB (MRN [id number]) as of 02/02/2015 09:34  Ref. Range 09/05/2014 08:02 10/27/2014 10:41 01/23/2015 09:46  Cholesterol Latest Ref Range: 0-200 mg/dL 229 (H)  182  Triglycerides Latest Ref Range: <150 mg/dL 169 (H)  127  HDL Cholesterol Latest Ref Range: >=40 mg/dL 43  36 (L)  LDL (calc) Latest Ref Range: 0-99 mg/dL 152 (H)  121 (H)     Current Outpatient Prescriptions  Medication Sig Dispense Refill  . aspirin 81 MG tablet Take 81 mg by mouth every morning.     . Coenzyme Q10 (COQ10) 100 MG CAPS Take 100 mg by mouth.     . dorzolamide-timolol (COSOPT) 22.3-6.8 MG/ML ophthalmic solution Place 1 drop into both eyes 2 (two) times daily.     Marland Kitchen ezetimibe (ZETIA) 10 MG tablet Take 1 tablet (10 mg total) by mouth daily. 30 tablet 0  . metoprolol succinate (TOPROL-XL) 25 MG 24 hr tablet Take 1 tablet (25 mg total) by mouth daily. 30 tablet 11  . Multiple Vitamin (MULTIVITAMIN) tablet Take 1 tablet by mouth daily.     No current facility-administered medications for this visit.    Allergies  Allergen Reactions  . Darvon [Propoxyphene Hcl] Itching  . Statins     Has tried pravachol, crestor and  lipitor    Past Medical History  Diagnosis Date  . Hypertension   . History of heart attack   . Coronary artery disease     status post coronary artery bypass grafting October 2008 by Dr. Arvid Right   . Hyperlipidemia     intolerant to statin drugs  . Myocardial infarction   . GERD (gastroesophageal reflux disease)     Height 6\' 3"  (1.905 m), weight 254 lb 14.4 oz (115.622 kg).   Tommy Medal PharmD CPP [facility]

## 2015-02-02 NOTE — Assessment & Plan Note (Addendum)
Pt with ASCVD had LDL cholesterol drop from 152 to 121 on ezetimibe 10 mg daily.  Has attempted multiple statins with repeated myopathies.  LDL goal is <70.  Reviewed healthy lifestyle and dietary guidelines with patient.  Will file for Repatha 140 mg q14 days.  Patient aware medication is SQ, has no concerns about self-injection.

## 2015-06-26 DIAGNOSIS — H53002 Unspecified amblyopia, left eye: Secondary | ICD-10-CM | POA: Diagnosis not present

## 2015-06-26 DIAGNOSIS — H401131 Primary open-angle glaucoma, bilateral, mild stage: Secondary | ICD-10-CM | POA: Diagnosis not present

## 2015-06-26 DIAGNOSIS — H52203 Unspecified astigmatism, bilateral: Secondary | ICD-10-CM | POA: Diagnosis not present

## 2015-06-28 DIAGNOSIS — M542 Cervicalgia: Secondary | ICD-10-CM | POA: Diagnosis not present

## 2015-06-28 DIAGNOSIS — M7581 Other shoulder lesions, right shoulder: Secondary | ICD-10-CM | POA: Diagnosis not present

## 2015-08-09 DIAGNOSIS — M7581 Other shoulder lesions, right shoulder: Secondary | ICD-10-CM | POA: Diagnosis not present

## 2015-08-09 DIAGNOSIS — M542 Cervicalgia: Secondary | ICD-10-CM | POA: Diagnosis not present

## 2015-08-15 DIAGNOSIS — M25511 Pain in right shoulder: Secondary | ICD-10-CM | POA: Diagnosis not present

## 2015-08-24 DIAGNOSIS — M25511 Pain in right shoulder: Secondary | ICD-10-CM | POA: Diagnosis not present

## 2015-08-24 DIAGNOSIS — M542 Cervicalgia: Secondary | ICD-10-CM | POA: Diagnosis not present

## 2015-08-28 DIAGNOSIS — M542 Cervicalgia: Secondary | ICD-10-CM | POA: Diagnosis not present

## 2015-08-28 DIAGNOSIS — M25511 Pain in right shoulder: Secondary | ICD-10-CM | POA: Diagnosis not present

## 2015-08-30 DIAGNOSIS — M25511 Pain in right shoulder: Secondary | ICD-10-CM | POA: Diagnosis not present

## 2015-08-30 DIAGNOSIS — M542 Cervicalgia: Secondary | ICD-10-CM | POA: Diagnosis not present

## 2015-09-04 DIAGNOSIS — M542 Cervicalgia: Secondary | ICD-10-CM | POA: Diagnosis not present

## 2015-09-04 DIAGNOSIS — M25511 Pain in right shoulder: Secondary | ICD-10-CM | POA: Diagnosis not present

## 2015-09-18 DIAGNOSIS — M25511 Pain in right shoulder: Secondary | ICD-10-CM | POA: Diagnosis not present

## 2015-09-18 DIAGNOSIS — M542 Cervicalgia: Secondary | ICD-10-CM | POA: Diagnosis not present

## 2015-09-21 DIAGNOSIS — M25511 Pain in right shoulder: Secondary | ICD-10-CM | POA: Diagnosis not present

## 2015-09-21 DIAGNOSIS — M542 Cervicalgia: Secondary | ICD-10-CM | POA: Diagnosis not present

## 2015-09-27 DIAGNOSIS — M25511 Pain in right shoulder: Secondary | ICD-10-CM | POA: Diagnosis not present

## 2015-09-27 DIAGNOSIS — M542 Cervicalgia: Secondary | ICD-10-CM | POA: Diagnosis not present

## 2015-10-03 DIAGNOSIS — J321 Chronic frontal sinusitis: Secondary | ICD-10-CM | POA: Diagnosis not present

## 2015-10-03 DIAGNOSIS — R05 Cough: Secondary | ICD-10-CM | POA: Diagnosis not present

## 2015-10-04 DIAGNOSIS — M25511 Pain in right shoulder: Secondary | ICD-10-CM | POA: Diagnosis not present

## 2015-10-04 DIAGNOSIS — M542 Cervicalgia: Secondary | ICD-10-CM | POA: Diagnosis not present

## 2015-10-18 DIAGNOSIS — M25511 Pain in right shoulder: Secondary | ICD-10-CM | POA: Diagnosis not present

## 2015-10-18 DIAGNOSIS — M542 Cervicalgia: Secondary | ICD-10-CM | POA: Diagnosis not present

## 2015-10-23 DIAGNOSIS — M7581 Other shoulder lesions, right shoulder: Secondary | ICD-10-CM | POA: Diagnosis not present

## 2015-11-27 ENCOUNTER — Other Ambulatory Visit: Payer: Self-pay | Admitting: Cardiovascular Disease

## 2015-11-27 DIAGNOSIS — M7581 Other shoulder lesions, right shoulder: Secondary | ICD-10-CM | POA: Diagnosis not present

## 2015-11-27 DIAGNOSIS — M7582 Other shoulder lesions, left shoulder: Secondary | ICD-10-CM | POA: Diagnosis not present

## 2015-12-28 DIAGNOSIS — H401122 Primary open-angle glaucoma, left eye, moderate stage: Secondary | ICD-10-CM | POA: Diagnosis not present

## 2015-12-28 DIAGNOSIS — H401111 Primary open-angle glaucoma, right eye, mild stage: Secondary | ICD-10-CM | POA: Diagnosis not present

## 2016-01-16 ENCOUNTER — Other Ambulatory Visit: Payer: Self-pay | Admitting: Cardiovascular Disease

## 2016-01-17 NOTE — Telephone Encounter (Signed)
Rx has been sent to the pharmacy electronically. ° °

## 2016-01-28 ENCOUNTER — Telehealth: Payer: Self-pay | Admitting: *Deleted

## 2016-01-28 DIAGNOSIS — L821 Other seborrheic keratosis: Secondary | ICD-10-CM | POA: Diagnosis not present

## 2016-01-28 DIAGNOSIS — L304 Erythema intertrigo: Secondary | ICD-10-CM | POA: Diagnosis not present

## 2016-01-28 DIAGNOSIS — E785 Hyperlipidemia, unspecified: Secondary | ICD-10-CM | POA: Diagnosis not present

## 2016-01-28 DIAGNOSIS — Z79899 Other long term (current) drug therapy: Secondary | ICD-10-CM

## 2016-01-28 DIAGNOSIS — L820 Inflamed seborrheic keratosis: Secondary | ICD-10-CM | POA: Diagnosis not present

## 2016-01-28 LAB — HEPATIC FUNCTION PANEL
ALBUMIN: 3.6 g/dL (ref 3.6–5.1)
ALK PHOS: 89 U/L (ref 40–115)
ALT: 11 U/L (ref 9–46)
AST: 14 U/L (ref 10–35)
BILIRUBIN INDIRECT: 0.4 mg/dL (ref 0.2–1.2)
BILIRUBIN TOTAL: 0.5 mg/dL (ref 0.2–1.2)
Bilirubin, Direct: 0.1 mg/dL (ref <=0.2)
TOTAL PROTEIN: 6.1 g/dL (ref 6.1–8.1)

## 2016-01-28 LAB — LIPID PANEL
Cholesterol: 234 mg/dL — ABNORMAL HIGH (ref 125–200)
HDL: 41 mg/dL (ref >=40)
LDL CALC: 165 mg/dL — AB (ref <130)
TRIGLYCERIDES: 141 mg/dL (ref <150)
Total CHOL/HDL Ratio: 5.7 Ratio — ABNORMAL HIGH (ref <=5.0)
VLDL: 28 mg/dL (ref <30)

## 2016-01-28 NOTE — Telephone Encounter (Signed)
Received call from [facility] - pt presented for annual labwork. Lipid and hep fxn ordered - Caller aware.

## 2016-02-01 ENCOUNTER — Ambulatory Visit (INDEPENDENT_AMBULATORY_CARE_PROVIDER_SITE_OTHER): Payer: Medicare HMO | Admitting: Cardiovascular Disease

## 2016-02-01 ENCOUNTER — Encounter: Payer: Self-pay | Admitting: Cardiovascular Disease

## 2016-02-01 VITALS — BP 138/74 | HR 48 | Ht 75.0 in | Wt 259.0 lb

## 2016-02-01 DIAGNOSIS — I2510 Atherosclerotic heart disease of native coronary artery without angina pectoris: Secondary | ICD-10-CM

## 2016-02-01 DIAGNOSIS — H401111 Primary open-angle glaucoma, right eye, mild stage: Secondary | ICD-10-CM | POA: Diagnosis not present

## 2016-02-01 DIAGNOSIS — E785 Hyperlipidemia, unspecified: Secondary | ICD-10-CM

## 2016-02-01 DIAGNOSIS — I10 Essential (primary) hypertension: Secondary | ICD-10-CM | POA: Diagnosis not present

## 2016-02-01 DIAGNOSIS — I2583 Coronary atherosclerosis due to lipid rich plaque: Secondary | ICD-10-CM

## 2016-02-01 DIAGNOSIS — H401122 Primary open-angle glaucoma, left eye, moderate stage: Secondary | ICD-10-CM | POA: Diagnosis not present

## 2016-02-01 MED ORDER — METOPROLOL SUCCINATE ER 25 MG PO TB24: 25.0000 mg | ORAL_TABLET | Freq: Every day | ORAL | Status: DC

## 2016-02-01 MED ORDER — EZETIMIBE 10 MG PO TABS: 10.0000 mg | ORAL_TABLET | Freq: Every day | ORAL | Status: DC

## 2016-02-01 NOTE — Assessment & Plan Note (Signed)
History of hypertension with blood pressure measured at 130/74. He is on metoprolol. Continue current meds at current dosing

## 2016-02-01 NOTE — Assessment & Plan Note (Signed)
History of coronary artery disease status post bypass grafting by Dr. Cyndia Bent October 2008 with a LIMA to his LAD, vein to the first and second marginal branches sequentially and a vein to the PDA.His EF at that time was 35%. His last 2-D echo 08/27/07, after his bypass surgery revealed normal ejection fraction. He denies chest pain or shortness of breath.

## 2016-02-01 NOTE — Assessment & Plan Note (Signed)
History of hyperlipidemia and intolerant to statin drugs with recent lipid profile performed 01/28/16 revealing a total vessel to 34, LDL 165 and HDL 41. We'll explore the possibility of using a PCSK9 monoclonal deficits financially feasible.

## 2016-02-01 NOTE — Patient Instructions (Addendum)
Medication Instructions:  Your physician recommends that you continue on your current medications as directed. Please refer to the Current Medication list given to you today.  Prescriptions for Zetia and Metoprolol sent to pharmacy.   Labwork: none  Testing/Procedures: none  Follow-Up: Your physician wants you to follow-up in: 12 months with Dr Gwenlyn Found. You will receive a reminder letter in the mail two months in advance. If you don't receive a letter, please call our office to schedule the follow-up appointment.   Any Other Special Instructions Will Be Listed Below (If Applicable).     If you need a refill on your cardiac medications before your next appointment, please call your pharmacy.

## 2016-02-01 NOTE — Progress Notes (Signed)
02/01/2016 Harold Garner   Oct 25, 1950  [id number]  Primary Physician WEBB, Valla Leaver, MD Primary Cardiologist: Lorretta Harp MD [name]   HPI:  The patient returns today for annual followup. He is a 65 year old moderately overweight married Caucasian male with no children who I last saw 10/27/14. He has a history of CAD status post coronary artery bypass grafting in October of 2008 by Dr. Gilford Raid. He had a LIMA to his LAD, a vein to a 1st and 2nd marginal branch sequentially, and a vein to the PDA. His EF was 35% at that time with followup echo revealing normalization of his ejection fraction 12/08. He also has hyperlipidemia, intolerant to statin drugs. He denies chest pain or shortness of breath.   Current Outpatient Prescriptions  Medication Sig Dispense Refill  . aspirin 81 MG tablet Take 81 mg by mouth every morning.     . Coenzyme Q10 (COQ10) 100 MG CAPS Take 100 mg by mouth.     . dorzolamide-timolol (COSOPT) 22.3-6.8 MG/ML ophthalmic solution Place 1 drop into both eyes 2 (two) times daily.     . metoprolol succinate (TOPROL-XL) 25 MG 24 hr tablet TAKE 1 TABLET BY MOUTH DAILY 30 tablet 0  . Multiple Vitamin (MULTIVITAMIN) tablet Take 1 tablet by mouth daily.     No current facility-administered medications for this visit.    Allergies  Allergen Reactions  . Darvon [Propoxyphene Hcl] Itching  . Statins     Has tried pravachol, crestor and lipitor    Social History   Social History  . Marital Status: Married    Spouse Name: N/A  . Number of Children: N/A  . Years of Education: N/A   Occupational History  . Not on file.   Social History Main Topics  . Smoking status: Light Tobacco Smoker    Types: Cigars  . Smokeless tobacco: Never Used     Comment: chew on them mostly  . Alcohol Use: Yes  . Drug Use: No  . Sexual Activity: Not on file   Other Topics Concern  . Not on file   Social History Narrative     Review of  Systems: General: negative for chills, fever, night sweats or weight changes.  Cardiovascular: negative for chest pain, dyspnea on exertion, edema, orthopnea, palpitations, paroxysmal nocturnal dyspnea or shortness of breath Dermatological: negative for rash Respiratory: negative for cough or wheezing Urologic: negative for hematuria Abdominal: negative for nausea, vomiting, diarrhea, bright red blood per rectum, melena, or hematemesis Neurologic: negative for visual changes, syncope, or dizziness All other systems reviewed and are otherwise negative except as noted above.    Blood pressure 138/74, pulse 48, height 6\' 3"  (1.905 m), weight 259 lb (117.482 kg).  General appearance: alert and no distress Neck: no adenopathy, no carotid bruit, no JVD, supple, symmetrical, trachea midline and thyroid not enlarged, symmetric, no tenderness/mass/nodules Lungs: clear to auscultation bilaterally Heart: regular rate and rhythm, S1, S2 normal, no murmur, click, rub or gallop Extremities: extremities normal, atraumatic, no cyanosis or edema  EKG sinus bradycardia at 55 with sinus arrhythmia and nonspecific ST and T-wave changes. I personally reviewed this EKG  ASSESSMENT AND PLAN:   Essential hypertension History of hypertension with blood pressure measured at 130/74. He is on metoprolol. Continue current meds at current dosing  Hyperlipidemia History of hyperlipidemia and intolerant to statin drugs with recent lipid profile performed 01/28/16 revealing a total vessel to 34, LDL 165 and HDL 41. We'll explore  the possibility of using a PCSK9 monoclonal deficits financially feasible.  Coronary artery disease History of coronary artery disease status post bypass grafting by Dr. Cyndia Bent October 2008 with a LIMA to his LAD, vein to the first and second marginal branches sequentially and a vein to the PDA.His EF at that time was 35%. His last 2-D echo 08/27/07, after his bypass surgery revealed normal  ejection fraction. He denies chest pain or shortness of breath.      Lorretta Harp MD FACP,FACC,FAHA, [facility] 02/01/2016 10:19 AM

## 2016-02-14 ENCOUNTER — Other Ambulatory Visit: Payer: Self-pay | Admitting: Pharmacist Clinician (PhC)/ Clinical Pharmacy Specialist

## 2016-02-14 MED ORDER — EVOLOCUMAB 140 MG/ML ~~LOC~~ SOAJ: 140.0000 mg | SUBCUTANEOUS | Status: DC

## 2016-02-14 NOTE — Telephone Encounter (Signed)
Repatha approved until 02-05-17.  Sent to [employer].

## 2016-02-19 ENCOUNTER — Telehealth: Payer: Self-pay | Admitting: *Deleted

## 2016-02-19 NOTE — Telephone Encounter (Signed)
Returned call to pt as I had received a message about his zetia being too expensive. Pt said his co-pay for brand is around $300 and for generic is around $260.  He also said the Repatha was approved but it would cost him around $500 per month. He said these are too expensive for him. He has been seen by pharmacy in the past.   Routing to pharmacist for advice.

## 2016-02-19 NOTE — Telephone Encounter (Signed)
We can have him apply for patient assistance for the [place]. It is a quick form that he will need to fill out. I can leave one for him at the front desk. Have him come to the office and fill it out at his earliest convenience and we will see if we can get the medication covered through this plan.

## 2016-02-19 NOTE — Telephone Encounter (Signed)
Called pt and let him know that the form would be at the front desk for him to pick up at his earliest convenience. He verbalized understanding and said thank you.

## 2016-02-25 ENCOUNTER — Telehealth: Payer: Self-pay | Admitting: Pharmacist

## 2016-02-25 NOTE — Telephone Encounter (Signed)
Returned call to patient about Repatha. He states his copay is $470 per month. He is unable to afford the medication at this cost. He has finished filling out patient assistance paperwork and will bring to our office to be completed and submitted to see if this will decrease his payment.

## 2016-03-10 ENCOUNTER — Telehealth: Payer: Self-pay | Admitting: Pharmacist

## 2016-03-10 NOTE — Telephone Encounter (Signed)
Called patient about Repatha approval through [employer]. He has been approved for Repatha through the end of this year 2017. The company will call him to arrange shipment. Gave him contact number in case they do not call. He states he understands and will call when first shipment arrives so we can set up f/u labs.

## 2016-03-28 DIAGNOSIS — B351 Tinea unguium: Secondary | ICD-10-CM | POA: Diagnosis not present

## 2016-03-28 DIAGNOSIS — L820 Inflamed seborrheic keratosis: Secondary | ICD-10-CM | POA: Diagnosis not present

## 2016-03-28 DIAGNOSIS — D225 Melanocytic nevi of trunk: Secondary | ICD-10-CM | POA: Diagnosis not present

## 2016-03-28 DIAGNOSIS — D2271 Melanocytic nevi of right lower limb, including hip: Secondary | ICD-10-CM | POA: Diagnosis not present

## 2016-03-28 DIAGNOSIS — L821 Other seborrheic keratosis: Secondary | ICD-10-CM | POA: Diagnosis not present

## 2016-03-28 DIAGNOSIS — D1801 Hemangioma of skin and subcutaneous tissue: Secondary | ICD-10-CM | POA: Diagnosis not present

## 2016-03-28 DIAGNOSIS — D2262 Melanocytic nevi of left upper limb, including shoulder: Secondary | ICD-10-CM | POA: Diagnosis not present

## 2016-04-10 ENCOUNTER — Ambulatory Visit (INDEPENDENT_AMBULATORY_CARE_PROVIDER_SITE_OTHER): Payer: Medicare HMO | Admitting: Pharmacist

## 2016-04-10 ENCOUNTER — Encounter: Payer: Self-pay | Admitting: Pharmacist

## 2016-04-10 VITALS — Wt 258.0 lb

## 2016-04-10 DIAGNOSIS — E785 Hyperlipidemia, unspecified: Secondary | ICD-10-CM

## 2016-04-10 NOTE — Patient Instructions (Signed)
Please go to the lab prior to your 4th injection (the week of Aug 28th) and have a fasting lipid panel.   Please call [phone] if you have any issues or questions about the medication.

## 2016-04-10 NOTE — Progress Notes (Signed)
Patient ID: Harold Garner                 DOB: Nov 01, 1950                    MRN: [id number]     HPI: Harold Garner is a 65 y.o. male patient of Dr. Gwenlyn Found here for his first Repatha injection. He was approved for medication through the end of the year free of charge through the drug company.    Current Medications:  Repatha 140mg  every 14 days  RF: ASCVD- MI with CABG x 3 in 2008  Meds: ezetimibe 10 mg qd  Tried in the past:  Atorvastatin - chart notes from initial visit in 2008 indicate unable to tolerate Rosuvastatin 5 mg (Oct 2008-Jan 2009) Pravastatin 20 mg (Feb 2009-Mar 2009) Simvastatin 20 mg (July 2010) Rosuvastatin 5 mg three times weekly - down to once weekly (Feb 2019 - Mar 2016)  Family history: mother died from MI at age 35  Diet: eats cereals.oatmeal for breakfast, with occasional biscuit; avoids fried foods, but does eat high carbs (breads, potatoes)  Exercise: no organized exercise, does yard work, keeps active  Labs: TC 234  TG 141  HDL 41  LDL 165   Past Medical History  Diagnosis Date  . Hypertension   . History of heart attack   . Coronary artery disease     status post coronary artery bypass grafting October 2008 by Dr. Arvid Right   . Hyperlipidemia     intolerant to statin drugs  . Myocardial infarction ([place])   . GERD (gastroesophageal reflux disease)     Current Outpatient Prescriptions on File Prior to Visit  Medication Sig Dispense Refill  . aspirin 81 MG tablet Take 81 mg by mouth every morning.     . Coenzyme Q10 (COQ10) 100 MG CAPS Take 100 mg by mouth.     . dorzolamide-timolol (COSOPT) 22.3-6.8 MG/ML ophthalmic solution Place 1 drop into both eyes 2 (two) times daily.     . Evolocumab (REPATHA SURECLICK) [id number] MG/ML SOAJ Inject 140 mg into the skin every 14 (fourteen) days. 2 pen 11  . ezetimibe (ZETIA) 10 MG tablet Take 1 tablet (10 mg total) by mouth daily. 90 tablet  3  . metoprolol succinate (TOPROL-XL) 25 MG 24 hr tablet Take 1 tablet (25 mg total) by mouth daily. 90 tablet 3  . Multiple Vitamin (MULTIVITAMIN) tablet Take 1 tablet by mouth daily.     No current facility-administered medications on file prior to visit.    Allergies  Allergen Reactions  . Darvon [Propoxyphene Hcl] Itching  . Statins     Has tried pravachol, crestor and lipitor    Assessment/Plan: Hyperlipidemia: LDL not at goal. First dose of Repatha given today in office. Labs ordered for prior to 4th injection. Follow up with Dr. Gwenlyn Found as recommended.   Thank you, Lelan Pons. [place], [place]

## 2016-05-23 DIAGNOSIS — E785 Hyperlipidemia, unspecified: Secondary | ICD-10-CM | POA: Diagnosis not present

## 2016-05-23 LAB — HEPATIC FUNCTION PANEL
ALBUMIN: 3.7 g/dL (ref 3.6–5.1)
ALK PHOS: 78 U/L (ref 40–115)
ALT: 11 U/L (ref 9–46)
AST: 16 U/L (ref 10–35)
BILIRUBIN TOTAL: 0.4 mg/dL (ref 0.2–1.2)
Bilirubin, Direct: 0.1 mg/dL (ref <=0.2)
Indirect Bilirubin: 0.3 mg/dL (ref 0.2–1.2)
Total Protein: 5.9 g/dL — ABNORMAL LOW (ref 6.1–8.1)

## 2016-05-23 LAB — LIPID PANEL
Cholesterol: 136 mg/dL (ref 125–200)
HDL: 44 mg/dL (ref >=40)
LDL CALC: 67 mg/dL (ref <130)
Total CHOL/HDL Ratio: 3.1 Ratio (ref <=5.0)
Triglycerides: 124 mg/dL (ref <150)
VLDL: 25 mg/dL (ref <30)

## 2016-05-27 ENCOUNTER — Encounter: Payer: Self-pay | Admitting: *Deleted

## 2016-07-22 ENCOUNTER — Telehealth: Payer: Self-pay | Admitting: Pharmacist

## 2016-07-22 NOTE — Telephone Encounter (Signed)
Pt called about renewal of [employer] for Repatha. He just received another shipment and stated that the company instructed him to notify doctor to process paperwork for renewal in November.   Will f/u with [employer] to see about renewal before beginning of next year as approval runs through 09/21/16.

## 2016-07-29 DIAGNOSIS — R112 Nausea with vomiting, unspecified: Secondary | ICD-10-CM | POA: Diagnosis not present

## 2016-07-29 DIAGNOSIS — R197 Diarrhea, unspecified: Secondary | ICD-10-CM | POA: Diagnosis not present

## 2016-07-30 NOTE — Telephone Encounter (Signed)
LMTCB about signing [employer] form and updated insurance for resubmission.

## 2016-07-31 NOTE — Telephone Encounter (Signed)
Spoke to patient and he will come by office to fill out form.

## 2016-08-04 DIAGNOSIS — H401122 Primary open-angle glaucoma, left eye, moderate stage: Secondary | ICD-10-CM | POA: Diagnosis not present

## 2016-08-04 DIAGNOSIS — H401111 Primary open-angle glaucoma, right eye, mild stage: Secondary | ICD-10-CM | POA: Diagnosis not present

## 2016-08-18 ENCOUNTER — Other Ambulatory Visit: Payer: Self-pay | Admitting: Cardiovascular Disease

## 2016-08-18 MED ORDER — METOPROLOL SUCCINATE ER 25 MG PO TB24: 25.0000 mg | ORAL_TABLET | Freq: Every day | ORAL | 1 refills | Status: DC

## 2016-08-18 NOTE — Telephone Encounter (Signed)
Metoprolol 2.5mg

## 2016-08-18 NOTE — Telephone Encounter (Signed)
Rx request sent to pharmacy.  

## 2016-08-25 DIAGNOSIS — Z23 Encounter for immunization: Secondary | ICD-10-CM | POA: Diagnosis not present

## 2017-02-02 DIAGNOSIS — H401131 Primary open-angle glaucoma, bilateral, mild stage: Secondary | ICD-10-CM | POA: Diagnosis not present

## 2017-02-02 DIAGNOSIS — H52203 Unspecified astigmatism, bilateral: Secondary | ICD-10-CM | POA: Diagnosis not present

## 2017-02-02 DIAGNOSIS — Z961 Presence of intraocular lens: Secondary | ICD-10-CM | POA: Diagnosis not present

## 2017-02-20 ENCOUNTER — Ambulatory Visit (INDEPENDENT_AMBULATORY_CARE_PROVIDER_SITE_OTHER): Payer: Medicare HMO | Admitting: Cardiovascular Disease

## 2017-02-20 ENCOUNTER — Other Ambulatory Visit: Payer: Self-pay | Admitting: Cardiovascular Disease

## 2017-02-20 ENCOUNTER — Encounter: Payer: Self-pay | Admitting: Cardiovascular Disease

## 2017-02-20 VITALS — BP 142/86 | HR 56 | Ht 75.0 in | Wt 254.6 lb

## 2017-02-20 DIAGNOSIS — I10 Essential (primary) hypertension: Secondary | ICD-10-CM

## 2017-02-20 DIAGNOSIS — I2583 Coronary atherosclerosis due to lipid rich plaque: Secondary | ICD-10-CM | POA: Diagnosis not present

## 2017-02-20 DIAGNOSIS — E785 Hyperlipidemia, unspecified: Secondary | ICD-10-CM | POA: Diagnosis not present

## 2017-02-20 DIAGNOSIS — I2510 Atherosclerotic heart disease of native coronary artery without angina pectoris: Secondary | ICD-10-CM

## 2017-02-20 NOTE — Telephone Encounter (Signed)
New message    *STAT* If patient is at the pharmacy, call can be transferred to refill team.   1. Which medications need to be refilled? (please list name of each medication and dose if known)  metoprolol succinate (TOPROL-XL) 25 MG 24 hr tablet Take 1 tablet (25 mg total) by mouth daily     2. Which pharmacy/location (including street and city if local pharmacy) is medication to be sent to? [facility] on [address]   3. Do they need a 30 day or 90 day supply? [place]

## 2017-02-20 NOTE — Patient Instructions (Signed)

## 2017-02-20 NOTE — Assessment & Plan Note (Signed)
History of CAD status post bypass grafting October 2008 by Dr. Cyndia Bent . He had a LIMA to his LAD, vein to the first and second marginal branches sequentially and PDA. Initial EF was 35% in close follow-up and normalized by 2-D echo 12/08. He is currently asymptomatic.

## 2017-02-20 NOTE — Assessment & Plan Note (Signed)
History of hypertension with blood pressure measured today at 142/86. He is on Toprol. Continue current meds at current dosing.

## 2017-02-20 NOTE — Assessment & Plan Note (Signed)
History of hyperlipidemia intolerant to statin drugs on Zetia and Repatha . We'll recheck a lipid and liver profile.

## 2017-02-20 NOTE — Progress Notes (Signed)
02/20/2017 Harold Garner   1950-10-27  [id number]  Primary Physician Maurice Small, MD Primary Cardiologist: Lorretta Harp MD [name]  HPI:  The patient returns today for annual followup. He is a 66 year old moderately overweight married Caucasian male with no children who I last saw 02/01/16. He has a history of CAD status post coronary artery bypass grafting in October of 2008 by Dr. Gilford Raid. He had a LIMA to his LAD, a vein to a 1st and 2nd marginal branch sequentially, and a vein to the PDA. His EF was 35% at that time with followup echo revealing normalization of his ejection fraction 12/08. He also has hyperlipidemia, intolerant to statin drugs. He denies chest pain or shortness of breath. Because of his statin intolerance he was begun on Repatha .   Current Outpatient Prescriptions  Medication Sig Dispense Refill  . aspirin 81 MG tablet Take 81 mg by mouth every morning.     . Coenzyme Q10 (COQ10) 100 MG CAPS Take 100 mg by mouth.     . Evolocumab (REPATHA SURECLICK) 299 MG/ML SOAJ Inject 140 mg into the skin every 14 (fourteen) days. 2 pen 11  . ezetimibe (ZETIA) 10 MG tablet Take 1 tablet (10 mg total) by mouth daily. 90 tablet 3  . metoprolol succinate (TOPROL-XL) 25 MG 24 hr tablet Take 1 tablet (25 mg total) by mouth daily. 90 tablet 1  . Multiple Vitamin (MULTIVITAMIN) tablet Take 1 tablet by mouth daily.    . timolol (TIMOPTIC) 0.5 % ophthalmic solution Place 1 drop into both eyes 2 (two) times daily.     No current facility-administered medications for this visit.     Allergies  Allergen Reactions  . Darvon [Propoxyphene Hcl] Itching  . Statins     Has tried pravachol, crestor and lipitor    Social History   Social History  . Marital status: Married    Spouse name: N/A  . Number of children: N/A  . Years of education: N/A   Occupational History  . Not on file.   Social History Main Topics  . Smoking status: Light Tobacco Smoker   Types: Cigars  . Smokeless tobacco: Never Used     Comment: chew on them mostly  . Alcohol use Yes  . Drug use: No  . Sexual activity: Not on file   Other Topics Concern  . Not on file   Social History Narrative  . No narrative on file     Review of Systems: General: negative for chills, fever, night sweats or weight changes.  Cardiovascular: negative for chest pain, dyspnea on exertion, edema, orthopnea, palpitations, paroxysmal nocturnal dyspnea or shortness of breath Dermatological: negative for rash Respiratory: negative for cough or wheezing Urologic: negative for hematuria Abdominal: negative for nausea, vomiting, diarrhea, bright red blood per rectum, melena, or hematemesis Neurologic: negative for visual changes, syncope, or dizziness All other systems reviewed and are otherwise negative except as noted above.    Blood pressure (!) 142/86, pulse (!) 56, height 6\' 3"  (1.905 m), weight 254 lb 9.6 oz (115.5 kg).  General appearance: alert and no distress Neck: no adenopathy, no carotid bruit, no JVD, supple, symmetrical, trachea midline and thyroid not enlarged, symmetric, no tenderness/mass/nodules Lungs: clear to auscultation bilaterally Heart: regular rate and rhythm, S1, S2 normal, no murmur, click, rub or gallop Extremities: extremities normal, atraumatic, no cyanosis or edema  EKG sinus bradycardia 56 with a Q wave in lead 3, nonspecific ST  and T-wave changes and sinus arrhythmia. I personally reviewed this EKG  ASSESSMENT AND PLAN:   Essential hypertension History of hypertension with blood pressure measured today at 142/86. He is on Toprol. Continue current meds at current dosing.  Hyperlipidemia History of hyperlipidemia intolerant to statin drugs on Zetia and Repatha . We'll recheck a lipid and liver profile.  Coronary artery disease History of CAD status post bypass grafting October 2008 by Dr. Cyndia Bent . He had a LIMA to his LAD, vein to the first and second  marginal branches sequentially and PDA. Initial EF was 35% in close follow-up and normalized by 2-D echo 12/08. He is currently asymptomatic.      Lorretta Harp MD FACP,FACC,FAHA, [facility] 02/20/2017 8:07 AM

## 2017-03-14 DIAGNOSIS — J019 Acute sinusitis, unspecified: Secondary | ICD-10-CM | POA: Diagnosis not present

## 2017-03-26 DIAGNOSIS — L814 Other melanin hyperpigmentation: Secondary | ICD-10-CM | POA: Diagnosis not present

## 2017-03-26 DIAGNOSIS — B351 Tinea unguium: Secondary | ICD-10-CM | POA: Diagnosis not present

## 2017-03-26 DIAGNOSIS — D225 Melanocytic nevi of trunk: Secondary | ICD-10-CM | POA: Diagnosis not present

## 2017-03-26 DIAGNOSIS — L820 Inflamed seborrheic keratosis: Secondary | ICD-10-CM | POA: Diagnosis not present

## 2017-03-26 DIAGNOSIS — L821 Other seborrheic keratosis: Secondary | ICD-10-CM | POA: Diagnosis not present

## 2017-03-26 DIAGNOSIS — D1801 Hemangioma of skin and subcutaneous tissue: Secondary | ICD-10-CM | POA: Diagnosis not present

## 2017-07-06 DIAGNOSIS — Z23 Encounter for immunization: Secondary | ICD-10-CM | POA: Diagnosis not present

## 2017-07-06 DIAGNOSIS — J029 Acute pharyngitis, unspecified: Secondary | ICD-10-CM | POA: Diagnosis not present

## 2017-07-17 DIAGNOSIS — E785 Hyperlipidemia, unspecified: Secondary | ICD-10-CM | POA: Diagnosis not present

## 2017-07-17 LAB — LIPID PANEL
CHOL/HDL RATIO: 3.6 ratio (ref 0.0–5.0)
Cholesterol, Total: 125 mg/dL (ref 100–199)
HDL: 35 mg/dL — ABNORMAL LOW (ref >39)
LDL Calculated: 70 mg/dL (ref 0–99)
TRIGLYCERIDES: 98 mg/dL (ref 0–149)
VLDL Cholesterol Cal: 20 mg/dL (ref 5–40)

## 2017-07-17 LAB — HEPATIC FUNCTION PANEL
ALT: 10 IU/L (ref 0–44)
AST: 14 IU/L (ref 0–40)
Albumin: 3.8 g/dL (ref 3.6–4.8)
Alkaline Phosphatase: 96 IU/L (ref 39–117)
BILIRUBIN TOTAL: 0.4 mg/dL (ref 0.0–1.2)
BILIRUBIN, DIRECT: 0.11 mg/dL (ref 0.00–0.40)
TOTAL PROTEIN: 5.9 g/dL — AB (ref 6.0–8.5)

## 2017-07-22 ENCOUNTER — Encounter: Payer: Self-pay | Admitting: Cardiovascular Disease

## 2017-07-24 DIAGNOSIS — T1511XA Foreign body in conjunctival sac, right eye, initial encounter: Secondary | ICD-10-CM | POA: Diagnosis not present

## 2017-07-27 DIAGNOSIS — H401131 Primary open-angle glaucoma, bilateral, mild stage: Secondary | ICD-10-CM | POA: Diagnosis not present

## 2017-10-15 ENCOUNTER — Telehealth: Payer: Self-pay | Admitting: Cardiovascular Disease

## 2017-10-15 NOTE — Telephone Encounter (Signed)
New message   Pt wife says that she is calling to check on pt's [employer] application for his repatha prescription and if it has been accepted

## 2017-10-23 NOTE — Telephone Encounter (Signed)
F/u call:  Harold Garner calling to check on status.

## 2017-10-23 NOTE — Telephone Encounter (Signed)
Spoke with wife - they actually got the call for approval just a few minutes after she called [place].    She knows to call again if they have any troubles getting the medication

## 2018-02-01 DIAGNOSIS — H35372 Puckering of macula, left eye: Secondary | ICD-10-CM | POA: Diagnosis not present

## 2018-02-01 DIAGNOSIS — H53002 Unspecified amblyopia, left eye: Secondary | ICD-10-CM | POA: Diagnosis not present

## 2018-02-01 DIAGNOSIS — Z961 Presence of intraocular lens: Secondary | ICD-10-CM | POA: Diagnosis not present

## 2018-02-13 ENCOUNTER — Other Ambulatory Visit: Payer: Self-pay | Admitting: Cardiovascular Disease

## 2018-02-16 NOTE — Telephone Encounter (Signed)
Rx(s) sent to pharmacy electronically.  

## 2018-03-05 ENCOUNTER — Encounter: Payer: Self-pay | Admitting: Cardiovascular Disease

## 2018-03-05 ENCOUNTER — Ambulatory Visit: Payer: Medicare HMO | Admitting: Cardiovascular Disease

## 2018-03-05 DIAGNOSIS — I10 Essential (primary) hypertension: Secondary | ICD-10-CM | POA: Diagnosis not present

## 2018-03-05 DIAGNOSIS — I2510 Atherosclerotic heart disease of native coronary artery without angina pectoris: Secondary | ICD-10-CM | POA: Diagnosis not present

## 2018-03-05 DIAGNOSIS — E7800 Pure hypercholesterolemia, unspecified: Secondary | ICD-10-CM

## 2018-03-05 NOTE — Assessment & Plan Note (Signed)
History of hyperlipidemia on Repatha.  His most recent lipid profile performed 07/17/2017 revealed an LDL of 70 DL of 35.  He is statin intolerant.

## 2018-03-05 NOTE — Progress Notes (Signed)
03/05/2018 Harold Garner   1951/07/07  [id number]  Primary Physician Maurice Small, MD Primary Cardiologist: Lorretta Harp MD FACP, [place], [place], [place]  HPI:  Harold Garner is a 67 y.o.  moderately overweight married Caucasian male with no children who I last saw  02/20/2017. He has a history of CAD status post coronary artery bypass grafting in October of 2008 by Dr. Gilford Raid. He had a LIMA to his LAD, a vein to a 1st and 2nd marginal branch sequentially, and a vein to the PDA. His EF was 35% at that time with followup echo revealing normalization of his ejection fraction 12/08. He also has hyperlipidemia, intolerant to statin drugs. He denies chest pain or shortness of breath. Because of his statin intolerance he was begun on Repatha .  He has had an excellent response to Repatha.     Current Meds  Medication Sig  . aspirin 81 MG tablet Take 81 mg by mouth every morning.   . Coenzyme Q10 (COQ10) 100 MG CAPS Take 100 mg by mouth.   . Evolocumab (REPATHA SURECLICK) 353 MG/ML SOAJ Inject 140 mg into the skin every 14 (fourteen) days.  . metoprolol succinate (TOPROL-XL) 25 MG 24 hr tablet Take 1 tablet (25 mg total) by mouth daily. PLEASE CONTACT OFFICE FOR ADDITIONAL REFILLS  . Multiple Vitamin (MULTIVITAMIN) tablet Take 1 tablet by mouth daily.  . ranitidine (ZANTAC) 150 MG capsule Take 150 mg by mouth daily.  . timolol (TIMOPTIC) 0.5 % ophthalmic solution Place 1 drop into both eyes 2 (two) times daily.  . [DISCONTINUED] ezetimibe (ZETIA) 10 MG tablet Take 1 tablet (10 mg total) by mouth daily.     Allergies  Allergen Reactions  . Darvon [Propoxyphene Hcl] Itching  . Statins     Has tried pravachol, crestor and lipitor    Social History   Socioeconomic History  . Marital status: Married    Spouse name: Not on file  . Number of children: Not on file  . Years of education: Not on file  . Highest education level: Not on file  Occupational History  . Not on file    Social Needs  . Financial resource strain: Not on file  . Food insecurity:    Worry: Not on file    Inability: Not on file  . Transportation needs:    Medical: Not on file    Non-medical: Not on file  Tobacco Use  . Smoking status: Light Tobacco Smoker    Types: Cigars  . Smokeless tobacco: Never Used  . Tobacco comment: chew on them mostly  Substance and Sexual Activity  . Alcohol use: Yes  . Drug use: No  . Sexual activity: Not on file  Lifestyle  . Physical activity:    Days per week: Not on file    Minutes per session: Not on file  . Stress: Not on file  Relationships  . Social connections:    Talks on phone: Not on file    Gets together: Not on file    Attends religious service: Not on file    Active member of club or organization: Not on file    Attends meetings of clubs or organizations: Not on file    Relationship status: Not on file  . Intimate partner violence:    Fear of current or ex partner: Not on file    Emotionally abused: Not on file    Physically abused: Not on file  Forced sexual activity: Not on file  Other Topics Concern  . Not on file  Social History Narrative  . Not on file     Review of Systems: General: negative for chills, fever, night sweats or weight changes.  Cardiovascular: negative for chest pain, dyspnea on exertion, edema, orthopnea, palpitations, paroxysmal nocturnal dyspnea or shortness of breath Dermatological: negative for rash Respiratory: negative for cough or wheezing Urologic: negative for hematuria Abdominal: negative for nausea, vomiting, diarrhea, bright red blood per rectum, melena, or hematemesis Neurologic: negative for visual changes, syncope, or dizziness All other systems reviewed and are otherwise negative except as noted above.    Blood pressure 139/79, pulse 62, height 6\' 3"  (1.905 m), weight 255 lb (115.7 kg).  General appearance: alert and no distress Neck: no adenopathy, no carotid bruit, no JVD,  supple, symmetrical, trachea midline and thyroid not enlarged, symmetric, no tenderness/mass/nodules Lungs: clear to auscultation bilaterally Heart: regular rate and rhythm, S1, S2 normal, no murmur, click, rub or gallop Extremities: extremities normal, atraumatic, no cyanosis or edema Pulses: 2+ and symmetric Skin: Skin color, texture, turgor normal. No rashes or lesions Neurologic: Alert and oriented X 3, normal strength and tone. Normal symmetric reflexes. Normal coordination and gait  EKG sinus rhythm at 62 without ST or T wave changes.  I Personally reviewed this EKG  ASSESSMENT AND PLAN:   Essential hypertension History of essential hypertension with blood pressure measured today at 139/79.  He is on metoprolol.  Continue current meds at current dosing.  Hyperlipidemia History of hyperlipidemia on Repatha.  His most recent lipid profile performed 07/17/2017 revealed an LDL of 70 DL of 35.  He is statin intolerant.  Coronary artery disease History of CAD status post coronary artery bypass grafting by Dr. Arvid Right October 2008.  He had a LIMA to his LAD, vein graft to the first and second marginal branch sequentially and vein to the PDA.  His EF initially was 35% but normalized by 2D echo 12/08.  He is completely asymptomatic.      Lorretta Harp MD FACP,FACC,FAHA, [facility] 03/05/2018 8:10 AM

## 2018-03-05 NOTE — Assessment & Plan Note (Signed)
History of CAD status post coronary artery bypass grafting by Dr. Arvid Right October 2008.  He had a LIMA to his LAD, vein graft to the first and second marginal branch sequentially and vein to the PDA.  His EF initially was 35% but normalized by 2D echo 12/08.  He is completely asymptomatic.

## 2018-03-05 NOTE — Patient Instructions (Signed)

## 2018-03-05 NOTE — Assessment & Plan Note (Signed)
History of essential hypertension with blood pressure measured today at 139/79.  He is on metoprolol.  Continue current meds at current dosing.

## 2018-03-11 DIAGNOSIS — H26491 Other secondary cataract, right eye: Secondary | ICD-10-CM | POA: Diagnosis not present

## 2018-03-14 ENCOUNTER — Other Ambulatory Visit: Payer: Self-pay | Admitting: Cardiovascular Disease

## 2018-03-15 NOTE — Telephone Encounter (Signed)
Rx request sent to pharmacy.  

## 2018-03-29 DIAGNOSIS — K130 Diseases of lips: Secondary | ICD-10-CM | POA: Diagnosis not present

## 2018-03-29 DIAGNOSIS — L821 Other seborrheic keratosis: Secondary | ICD-10-CM | POA: Diagnosis not present

## 2018-03-29 DIAGNOSIS — L820 Inflamed seborrheic keratosis: Secondary | ICD-10-CM | POA: Diagnosis not present

## 2018-03-29 DIAGNOSIS — D224 Melanocytic nevi of scalp and neck: Secondary | ICD-10-CM | POA: Diagnosis not present

## 2018-03-29 DIAGNOSIS — D225 Melanocytic nevi of trunk: Secondary | ICD-10-CM | POA: Diagnosis not present

## 2018-03-29 DIAGNOSIS — D485 Neoplasm of uncertain behavior of skin: Secondary | ICD-10-CM | POA: Diagnosis not present

## 2018-04-13 ENCOUNTER — Other Ambulatory Visit: Payer: Self-pay | Admitting: Cardiovascular Disease

## 2018-07-18 ENCOUNTER — Telehealth: Payer: Self-pay | Admitting: Pharmacist Clinician (PhC)/ Clinical Pharmacy Specialist

## 2018-07-18 DIAGNOSIS — E7800 Pure hypercholesterolemia, unspecified: Secondary | ICD-10-CM

## 2018-07-18 NOTE — Telephone Encounter (Signed)
Mailed new [employer] application and lab order 

## 2018-08-02 DIAGNOSIS — H5310 Unspecified subjective visual disturbances: Secondary | ICD-10-CM | POA: Diagnosis not present

## 2018-08-09 DIAGNOSIS — H4311 Vitreous hemorrhage, right eye: Secondary | ICD-10-CM | POA: Diagnosis not present

## 2018-08-10 DIAGNOSIS — H33311 Horseshoe tear of retina without detachment, right eye: Secondary | ICD-10-CM | POA: Diagnosis not present

## 2018-08-10 DIAGNOSIS — H43822 Vitreomacular adhesion, left eye: Secondary | ICD-10-CM | POA: Diagnosis not present

## 2018-08-10 DIAGNOSIS — H43813 Vitreous degeneration, bilateral: Secondary | ICD-10-CM | POA: Diagnosis not present

## 2018-08-10 DIAGNOSIS — H4311 Vitreous hemorrhage, right eye: Secondary | ICD-10-CM | POA: Diagnosis not present

## 2018-08-13 DIAGNOSIS — H4311 Vitreous hemorrhage, right eye: Secondary | ICD-10-CM | POA: Diagnosis not present

## 2018-08-23 ENCOUNTER — Other Ambulatory Visit: Payer: Self-pay | Admitting: Cardiovascular Disease

## 2018-08-23 DIAGNOSIS — E7800 Pure hypercholesterolemia, unspecified: Secondary | ICD-10-CM | POA: Diagnosis not present

## 2018-08-23 LAB — LIPID PANEL W/O CHOL/HDL RATIO
CHOLESTEROL TOTAL: 133 mg/dL (ref 100–199)
HDL: 40 mg/dL (ref >39)
LDL CALC: 74 mg/dL (ref 0–99)
Triglycerides: 95 mg/dL (ref 0–149)
VLDL Cholesterol Cal: 19 mg/dL (ref 5–40)

## 2018-08-24 ENCOUNTER — Encounter: Payer: Self-pay | Admitting: *Deleted

## 2018-08-30 DIAGNOSIS — H4311 Vitreous hemorrhage, right eye: Secondary | ICD-10-CM | POA: Diagnosis not present

## 2018-09-29 DIAGNOSIS — J0101 Acute recurrent maxillary sinusitis: Secondary | ICD-10-CM | POA: Diagnosis not present

## 2018-09-29 DIAGNOSIS — E785 Hyperlipidemia, unspecified: Secondary | ICD-10-CM | POA: Diagnosis not present

## 2018-09-29 DIAGNOSIS — I10 Essential (primary) hypertension: Secondary | ICD-10-CM | POA: Diagnosis not present

## 2018-09-29 DIAGNOSIS — Z23 Encounter for immunization: Secondary | ICD-10-CM | POA: Diagnosis not present

## 2018-10-04 DIAGNOSIS — H4311 Vitreous hemorrhage, right eye: Secondary | ICD-10-CM | POA: Diagnosis not present

## 2018-11-09 ENCOUNTER — Telehealth: Payer: Self-pay | Admitting: Cardiovascular Disease

## 2018-11-09 NOTE — Telephone Encounter (Signed)
Spoke with pt who states he recently had the flu in Jan and since then he has been experiencing SOB with exertion. He denies swelling or feeling in distress. Pt has an appointment scheduled to see Dr. Gwenlyn Found on 2/26 at 53 am. Pt advised to keep scheduled appointment and also contact PCP to make them aware. Pt also instructed to report to ED if SOB increases. Pt verbalized understanding.

## 2018-11-09 NOTE — Telephone Encounter (Signed)
Pt c/o Shortness Of Breath: STAT if SOB developed within the last 24 hours or pt is noticeably SOB on the phone  1. Are you currently SOB (can you hear that pt is SOB on the phone)? no*  2. How long have you been experiencing SOB?  A couple of week  3. Are you SOB when sitting or when up moving around? when he walk or does anything physcical  4. Are you currently experiencing any other symptoms? no

## 2018-11-17 ENCOUNTER — Encounter: Payer: Self-pay | Admitting: Cardiovascular Disease

## 2018-11-17 ENCOUNTER — Ambulatory Visit: Payer: Medicare HMO | Admitting: Cardiovascular Disease

## 2018-11-17 DIAGNOSIS — I10 Essential (primary) hypertension: Secondary | ICD-10-CM | POA: Diagnosis not present

## 2018-11-17 DIAGNOSIS — I2510 Atherosclerotic heart disease of native coronary artery without angina pectoris: Secondary | ICD-10-CM

## 2018-11-17 DIAGNOSIS — E782 Mixed hyperlipidemia: Secondary | ICD-10-CM | POA: Diagnosis not present

## 2018-11-17 NOTE — Assessment & Plan Note (Signed)
History of CAD status post coronary artery bypass grafting October 2008 by Dr. Arvid Right.  He had a LIMA to his LAD, vein to the first and second marginal branches sequentially and a vein to the PDA.  He had a EF initially was 35% however follow-up 2D echo revealed normalization of his EF 12/08.  He denies chest pain.  He did have what sounds like a right upper respiratory tract infection earlier this year which he is slow to recover from.

## 2018-11-17 NOTE — Progress Notes (Signed)
11/17/2018 Harold Garner   1950-12-30  [id number]  Primary Physician Maurice Small, MD Primary Cardiologist: Lorretta Harp MD FACP, [place], [place], [place]  HPI:  Harold Garner is a 68 y.o.  moderately overweight married Caucasian male with no children who I last saw  03/05/2018. He has a history of CAD status post coronary artery bypass grafting in October of 2008 by Dr. Gilford Raid. He had a LIMA to his LAD, a vein to a 1st and 2nd marginal branch sequentially, and a vein to the PDA. His EF was 35% at that time with followup echo revealing normalization of his ejection fraction 12/08. He also has hyperlipidemia, intolerant to statin drugs. He denies chest pain or shortness of breath.Because of his statin intolerance he was begun on Repatha.  He has had an excellent response to Repatha.  Since I saw him in June of last year he is done fairly well.  He developed a upper respiratory tract infection this January which he has slowed recover from it has noticed some shortness of breath and fatigue.  He denies chest pain.    Current Meds  Medication Sig  . aspirin 81 MG tablet Take 81 mg by mouth every morning.   . Coenzyme Q10 (COQ10) 100 MG CAPS Take 100 mg by mouth.   . Evolocumab (REPATHA SURECLICK) 474 MG/ML SOAJ Inject 140 mg into the skin every 14 (fourteen) days.  . famotidine (PEPCID) 20 MG tablet Take 20 mg by mouth daily.  . metoprolol succinate (TOPROL-XL) 25 MG 24 hr tablet TAKE ONE TABLET BY MOUTH DAILY . PLEASE CONTACT OFFICE FOR ADDITIONAL REFILLS  . Multiple Vitamin (MULTIVITAMIN) tablet Take 1 tablet by mouth daily.     Allergies  Allergen Reactions  . Darvon [Propoxyphene Hcl] Itching  . Statins     Has tried pravachol, crestor and lipitor    Social History   Socioeconomic History  . Marital status: Married    Spouse name: Not on file  . Number of children: Not on file  . Years of education: Not on file  . Highest education level: Not on file    Occupational History  . Not on file  Social Needs  . Financial resource strain: Not on file  . Food insecurity:    Worry: Not on file    Inability: Not on file  . Transportation needs:    Medical: Not on file    Non-medical: Not on file  Tobacco Use  . Smoking status: Light Tobacco Smoker    Types: Cigars  . Smokeless tobacco: Never Used  . Tobacco comment: chew on them mostly  Substance and Sexual Activity  . Alcohol use: Yes  . Drug use: No  . Sexual activity: Not on file  Lifestyle  . Physical activity:    Days per week: Not on file    Minutes per session: Not on file  . Stress: Not on file  Relationships  . Social connections:    Talks on phone: Not on file    Gets together: Not on file    Attends religious service: Not on file    Active member of club or organization: Not on file    Attends meetings of clubs or organizations: Not on file    Relationship status: Not on file  . Intimate partner violence:    Fear of current or ex partner: Not on file    Emotionally abused: Not on file    Physically abused: Not  on file    Forced sexual activity: Not on file  Other Topics Concern  . Not on file  Social History Narrative  . Not on file     Review of Systems: General: negative for chills, fever, night sweats or weight changes.  Cardiovascular: negative for chest pain, dyspnea on exertion, edema, orthopnea, palpitations, paroxysmal nocturnal dyspnea or shortness of breath Dermatological: negative for rash Respiratory: negative for cough or wheezing Urologic: negative for hematuria Abdominal: negative for nausea, vomiting, diarrhea, bright red blood per rectum, melena, or hematemesis Neurologic: negative for visual changes, syncope, or dizziness All other systems reviewed and are otherwise negative except as noted above.    Blood pressure 138/76, pulse 78, height 6\' 3"  (1.905 m), weight 256 lb 9.6 oz (116.4 kg).  General appearance: alert and no distress Neck:  no adenopathy, no carotid bruit, no JVD, supple, symmetrical, trachea midline and thyroid not enlarged, symmetric, no tenderness/mass/nodules Lungs: clear to auscultation bilaterally Heart: regular rate and rhythm, S1, S2 normal, no murmur, click, rub or gallop Extremities: extremities normal, atraumatic, no cyanosis or edema Pulses: 2+ and symmetric Skin: Skin color, texture, turgor normal. No rashes or lesions Neurologic: Alert and oriented X 3, normal strength and tone. Normal symmetric reflexes. Normal coordination and gait  EKG sinus rhythm at 78 with nonspecific ST and T wave changes.  I personally reviewed this EKG.  ASSESSMENT AND PLAN:   Essential hypertension History of essential hypertension her blood pressure measured today 138/76.  He is on metoprolol.  Hyperlipidemia History of hyperlipidemia on Repatha with lipid profile performed 08/23/2018 revealing total cluster 133, LDL 74 and HDL 40.  Coronary artery disease History of CAD status post coronary artery bypass grafting October 2008 by Dr. Arvid Right.  He had a LIMA to his LAD, vein to the first and second marginal branches sequentially and a vein to the PDA.  He had a EF initially was 35% however follow-up 2D echo revealed normalization of his EF 12/08.  He denies chest pain.  He did have what sounds like a right upper respiratory tract infection earlier this year which he is slow to recover from.      Lorretta Harp MD FACP,FACC,FAHA, [facility] 11/17/2018 12:47 PM

## 2018-11-17 NOTE — Assessment & Plan Note (Signed)
History of hyperlipidemia on Repatha with lipid profile performed 08/23/2018 revealing total cluster 133, LDL 74 and HDL 40.

## 2018-11-17 NOTE — Patient Instructions (Signed)
Medication Instructions:  Your physician recommends that you continue on your current medications as directed. Please refer to the Current Medication list given to you today.  If you need a refill on your cardiac medications before your next appointment, please call your pharmacy.   Lab work: NONE If you have labs (blood work) drawn today and your tests are completely normal, you will receive your results only by: Marland Kitchen MyChart Message (if you have MyChart) OR . A paper copy in the mail If you have any lab test that is abnormal or we need to change your treatment, we will call you to review the results.  Testing/Procedures: NONE  Follow-Up: At [facility], you and your health needs are our priority.  As part of our continuing mission to provide you with exceptional heart care, we have created designated Provider Care Teams.  These Care Teams include your primary Cardiologist (physician) and Advanced Practice Providers (APPs -  Physician Assistants and Nurse Practitioners) who all work together to provide you with the care you need, when you need it. . You will need a follow up appointment in 3 months WITH AN APP AND 6 months WITH DR. Gwenlyn Found.  Please call our office 2 months in advance to schedule this appointment.  You may see Dr. Gwenlyn Found or one of the following Advanced Practice Providers on your designated Care Team:   . Kerin Ransom, [place] . Almyra Deforest, PA-C . Fabian Sharp, PA-C . Jory Sims, DNP . Rosaria Ferries, PA-C . Roby Lofts, PA-C . Sande Rives, PA-C

## 2018-11-17 NOTE — Assessment & Plan Note (Signed)
History of essential hypertension her blood pressure measured today 138/76.  He is on metoprolol.

## 2018-11-23 ENCOUNTER — Ambulatory Visit: Payer: Medicare HMO | Admitting: Cardiovascular Disease

## 2018-12-03 ENCOUNTER — Other Ambulatory Visit: Payer: Self-pay

## 2018-12-03 ENCOUNTER — Encounter (HOSPITAL_COMMUNITY): Payer: Self-pay

## 2018-12-03 ENCOUNTER — Inpatient Hospital Stay (HOSPITAL_COMMUNITY)
Admission: EM | Admit: 2018-12-03 | Discharge: 2018-12-05 | DRG: 812 | Disposition: A | Discharge status: Home / Self Care | Payer: Medicare HMO | Source: Ambulatory Visit | Attending: Family Medicine | Admitting: Family Medicine

## 2018-12-03 DIAGNOSIS — D509 Iron deficiency anemia, unspecified: Secondary | ICD-10-CM | POA: Diagnosis not present

## 2018-12-03 DIAGNOSIS — Z125 Encounter for screening for malignant neoplasm of prostate: Secondary | ICD-10-CM | POA: Diagnosis not present

## 2018-12-03 DIAGNOSIS — E785 Hyperlipidemia, unspecified: Secondary | ICD-10-CM | POA: Diagnosis present

## 2018-12-03 DIAGNOSIS — Z951 Presence of aortocoronary bypass graft: Secondary | ICD-10-CM

## 2018-12-03 DIAGNOSIS — I2510 Atherosclerotic heart disease of native coronary artery without angina pectoris: Secondary | ICD-10-CM | POA: Diagnosis present

## 2018-12-03 DIAGNOSIS — R1084 Generalized abdominal pain: Secondary | ICD-10-CM | POA: Diagnosis not present

## 2018-12-03 DIAGNOSIS — F17290 Nicotine dependence, other tobacco product, uncomplicated: Secondary | ICD-10-CM | POA: Diagnosis present

## 2018-12-03 DIAGNOSIS — Z8249 Family history of ischemic heart disease and other diseases of the circulatory system: Secondary | ICD-10-CM | POA: Diagnosis not present

## 2018-12-03 DIAGNOSIS — I252 Old myocardial infarction: Secondary | ICD-10-CM

## 2018-12-03 DIAGNOSIS — E611 Iron deficiency: Secondary | ICD-10-CM | POA: Diagnosis not present

## 2018-12-03 DIAGNOSIS — R69 Illness, unspecified: Secondary | ICD-10-CM | POA: Diagnosis not present

## 2018-12-03 DIAGNOSIS — Z7982 Long term (current) use of aspirin: Secondary | ICD-10-CM

## 2018-12-03 DIAGNOSIS — K219 Gastro-esophageal reflux disease without esophagitis: Secondary | ICD-10-CM | POA: Diagnosis present

## 2018-12-03 DIAGNOSIS — I10 Essential (primary) hypertension: Secondary | ICD-10-CM | POA: Diagnosis not present

## 2018-12-03 DIAGNOSIS — R0602 Shortness of breath: Secondary | ICD-10-CM | POA: Diagnosis not present

## 2018-12-03 DIAGNOSIS — R5383 Other fatigue: Secondary | ICD-10-CM | POA: Diagnosis not present

## 2018-12-03 DIAGNOSIS — D649 Anemia, unspecified: Secondary | ICD-10-CM | POA: Diagnosis not present

## 2018-12-03 DIAGNOSIS — D1803 Hemangioma of intra-abdominal structures: Secondary | ICD-10-CM | POA: Diagnosis present

## 2018-12-03 DIAGNOSIS — R5382 Chronic fatigue, unspecified: Secondary | ICD-10-CM | POA: Diagnosis not present

## 2018-12-03 LAB — POC OCCULT BLOOD, ED: Fecal Occult Bld: NEGATIVE

## 2018-12-03 LAB — CBC
HCT: 19 % — ABNORMAL LOW (ref 39.0–52.0)
HEMOGLOBIN: 4.5 g/dL — AB (ref 13.0–17.0)
MCH: 16.2 pg — AB (ref 26.0–34.0)
MCHC: 23.7 g/dL — AB (ref 30.0–36.0)
MCV: 68.6 fL — ABNORMAL LOW (ref 80.0–100.0)
PLATELETS: 450 10*3/uL — AB (ref 150–400)
RBC: 2.77 MIL/uL — ABNORMAL LOW (ref 4.22–5.81)
RDW: 17.8 % — AB (ref 11.5–15.5)
WBC: 5.7 10*3/uL (ref 4.0–10.5)
nRBC: 0 % (ref 0.0–0.2)

## 2018-12-03 LAB — BASIC METABOLIC PANEL
Anion gap: 9 (ref 5–15)
BUN: 12 mg/dL (ref 8–23)
CO2: 22 mmol/L (ref 22–32)
CREATININE: 0.91 mg/dL (ref 0.61–1.24)
Calcium: 9 mg/dL (ref 8.9–10.3)
Chloride: 106 mmol/L (ref 98–111)
GFR calc Af Amer: >60 mL/min (ref >60)
GFR calc non Af Amer: >60 mL/min (ref >60)
Glucose, Bld: 117 mg/dL — ABNORMAL HIGH (ref 70–99)
Potassium: 4 mmol/L (ref 3.5–5.1)
Sodium: 137 mmol/L (ref 135–145)

## 2018-12-03 LAB — FOLATE: Folate: 18.9 ng/mL (ref >5.9)

## 2018-12-03 LAB — PREPARE RBC (CROSSMATCH)

## 2018-12-03 LAB — IRON AND TIBC
Iron: 9 ug/dL — ABNORMAL LOW (ref 45–182)
Saturation Ratios: 2 % — ABNORMAL LOW (ref 17.9–39.5)
TIBC: 496 ug/dL — ABNORMAL HIGH (ref 250–450)
UIBC: 487 ug/dL

## 2018-12-03 LAB — RETICULOCYTES
Immature Retic Fract: 22.2 % — ABNORMAL HIGH (ref 2.3–15.9)
RBC.: 2.63 MIL/uL — ABNORMAL LOW (ref 4.22–5.81)
Retic Count, Absolute: 35.5 10*3/uL (ref 19.0–186.0)
Retic Ct Pct: 1.4 % (ref 0.4–3.1)

## 2018-12-03 LAB — FERRITIN: FERRITIN: 3 ng/mL — AB (ref 24–336)

## 2018-12-03 LAB — VITAMIN B12: Vitamin B-12: 383 pg/mL (ref 180–914)

## 2018-12-03 MED ORDER — ACETAMINOPHEN 650 MG RE SUPP: 650.0000 mg | Freq: Four times a day (QID) | RECTAL | Status: DC | PRN

## 2018-12-03 MED ORDER — ONDANSETRON HCL 4 MG PO TABS: 4.0000 mg | ORAL_TABLET | Freq: Four times a day (QID) | ORAL | Status: DC | PRN

## 2018-12-03 MED ORDER — ONDANSETRON HCL 4 MG/2ML IJ SOLN: 4.0000 mg | Freq: Four times a day (QID) | INTRAMUSCULAR | Status: DC | PRN

## 2018-12-03 MED ORDER — SODIUM CHLORIDE 0.9% IV SOLUTION
Freq: Once | INTRAVENOUS | Status: AC
  Administered 2018-12-03: 10 mL via INTRAVENOUS

## 2018-12-03 MED ORDER — ACETAMINOPHEN 325 MG PO TABS: 650.0000 mg | ORAL_TABLET | Freq: Four times a day (QID) | ORAL | Status: DC | PRN

## 2018-12-03 MED ORDER — PANTOPRAZOLE SODIUM 40 MG PO TBEC
40.0000 mg | DELAYED_RELEASE_TABLET | Freq: Every day | ORAL | Status: DC
  Administered 2018-12-04 – 2018-12-05 (×2): 40 mg via ORAL
  Filled 2018-12-03 (×2): qty 1

## 2018-12-03 MED ORDER — METOPROLOL SUCCINATE ER 25 MG PO TB24
25.0000 mg | ORAL_TABLET | Freq: Every day | ORAL | Status: DC
  Administered 2018-12-05: 25 mg via ORAL
  Filled 2018-12-03 (×2): qty 1

## 2018-12-03 NOTE — ED Notes (Addendum)
ED TO INPATIENT HANDOFF REPORT  ED Nurse Name and Phone #:  Joellen Jersey [phone]  S Name/Age/Gender [name] 68 y.o. male Room/Bed: 018C/018C  Code Status   Code Status: Not on file  Home/SNF/Other Home Patient oriented to: self, place, time and situation Is this baseline? Yes   Triage Complete: Triage complete  Chief Complaint Sent by dr/blood transfusion  Triage Note Pt here from PCP with low hemoglobin.  Told it was 5.  Sent here for blood transfusion.  A&Ox4, ambulatory with no sob.       Allergies Allergies  Allergen Reactions  . Darvon [Propoxyphene Hcl] Itching  . Statins     Has tried pravachol, crestor and lipitor    Level of Care/Admitting Diagnosis ED Disposition    ED Disposition Condition [place]: [place] [100100]  Level of Care: Telemetry Medical [104]  I expect the patient will be discharged within 24 hours: No (not a candidate for 5C-Observation unit)  Diagnosis: Symptomatic anemia [3474259]  Admitting Physician: Rise Patience [phone]  Attending Physician: Rise Patience [[id number]]  PT Class (Do Not Modify): Observation [104]  PT Acc Code (Do Not Modify): Observation [10022]       B Medical/Surgery History Past Medical History:  Diagnosis Date  . Coronary artery disease    status post coronary artery bypass grafting October 2008 by Dr. Arvid Right   . GERD (gastroesophageal reflux disease)   . History of heart attack   . Hyperlipidemia    intolerant to statin drugs  . Hypertension   . Myocardial infarction [facility])    Past Surgical History:  Procedure Laterality Date  . CARDIAC CATHETERIZATION  06/25/2007   LAD 90%, RCA 80%, 3 vessel disease, moderate LV dysfunction.  . CORONARY ARTERY BYPASS GRAFT  06/2007   Dr Cyndia Bent  . HAND SURGERY Left   . NM MYOCAR PERF WALL MOTION  06/25/2011   protocol:Lexiscan, post stress EF 59%, low risk scan  . TONSILLECTOMY     childhood  .  TRANSTHORACIC ECHOCARDIOGRAM  08/27/2007   normal Echo     A IV Location/Drains/Wounds Patient Lines/Drains/Airways Status   Active Line/Drains/Airways    Name:   Placement date:   Placement time:   Site:   Days:   Peripheral IV 12/03/18 Right Hand   12/03/18    1900    Hand   less than 1   Peripheral IV 12/03/18 Right Wrist   12/03/18    1940    Wrist   less than 1          Intake/Output Last 24 hours No intake or output data in the 24 hours ending 12/03/18 1941  Labs/Imaging Results for orders placed or performed during the hospital encounter of 12/03/18 (from the past 48 hour(s))  CBC     Status: Abnormal   Collection Time: 12/03/18  5:05 PM  Result Value Ref Range   WBC 5.7 4.0 - 10.5 K/uL   RBC 2.77 (L) 4.22 - 5.81 MIL/uL   Hemoglobin 4.5 (LL) 13.0 - 17.0 g/dL    Comment: Reticulocyte Hemoglobin testing may be clinically indicated, consider ordering this additional test [id number] THIS CRITICAL RESULT HAS VERIFIED AND BEEN CALLED TO B FUNK,RN BY WALTER BOND ON 03 13 2020 AT 1752, AND HAS BEEN READ BACK.  REPEATED TO VERIFY SPECIMEN CHECKED FOR CLOTS CORRECTED ON 03/13 AT 1755: PREVIOUSLY REPORTED AS 4.5 Reticulocyte Hemoglobin testing may be clinically indicated, consider ordering this  additional test [id number] THIS CRITICAL RESULT HAS VERIFIED AND BEEN CALLED TO B FUNK,RN BY WALTER BOND ON 03 13  2020 AT 2426, AND HAS BEEN READ BACK.     HCT 19.0 (L) 39.0 - 52.0 %   MCV 68.6 (L) 80.0 - 100.0 fL   MCH 16.2 (L) 26.0 - 34.0 pg   MCHC 23.7 (L) 30.0 - 36.0 g/dL   RDW 17.8 (H) 11.5 - 15.5 %   Platelets 450 (H) 150 - 400 K/uL   nRBC 0.0 0.0 - 0.2 %    Comment: Performed at [facility] [address], [place], [place] [address]  Basic metabolic panel     Status: Abnormal   Collection Time: 12/03/18  5:05 PM  Result Value Ref Range   Sodium 137 135 - 145 mmol/L   Potassium 4.0 3.5 - 5.1 mmol/L   Chloride 106 98 - 111 mmol/L   CO2 22 22 - 32 mmol/L   Glucose, Bld  117 (H) 70 - 99 mg/dL   BUN 12 8 - 23 mg/dL   Creatinine, Ser 0.91 0.61 - 1.24 mg/dL   Calcium 9.0 8.9 - 10.3 mg/dL   GFR calc non Af Amer >60 >60 mL/min   GFR calc Af Amer >60 >60 mL/min   Anion gap 9 5 - 15    Comment: Performed at [facility], [place] [address], [place], [place] [address]  Type and screen [place]     Status: None (Preliminary result)   Collection Time: 12/03/18  5:05 PM  Result Value Ref Range   ABO/RH(D) A NEG    Antibody Screen NEG    Sample Expiration      12/06/2018 Performed at [facility], [place] [address] [place], [place] [address]    Unit Number [id number]    Blood Component Type RED CELLS,LR    Unit division 00    Status of Unit ALLOCATED    Transfusion Status OK TO TRANSFUSE    Crossmatch Result Compatible    Unit Number [id number]    Blood Component Type RBC LR PHER2    Unit division 00    Status of Unit ALLOCATED    Transfusion Status OK TO TRANSFUSE    Crossmatch Result Compatible   Prepare RBC     Status: None   Collection Time: 12/03/18  6:17 PM  Result Value Ref Range   Order Confirmation      ORDER PROCESSED BY BLOOD BANK Performed at [facility], [place] [address], [place], [place] [address]   Reticulocytes     Status: Abnormal   Collection Time: 12/03/18  7:01 PM  Result Value Ref Range   Retic Ct Pct 1.4 0.4 - 3.1 %   RBC. 2.63 (L) 4.22 - 5.81 MIL/uL   Retic Count, Absolute 35.5 19.0 - 186.0 K/uL   Immature Retic Fract 22.2 (H) 2.3 - 15.9 %    Comment: Performed at [facility] [address] [place], [place] [address]  POC occult blood, ED     Status: None   Collection Time: 12/03/18  7:34 PM  Result Value Ref Range   Fecal Occult Bld NEGATIVE NEGATIVE   No results found.  Pending Labs Unresulted Labs (From admission, onward)    Start     Ordered   12/03/18 1832  Vitamin B12  (Anemia Panel (PNL))  ONCE - STAT,   STAT     12/03/18 1831  12/03/18 1832  Folate  (Anemia Panel  (PNL))  ONCE - STAT,   STAT     12/03/18 1831   12/03/18 1832  Iron and TIBC  (Anemia Panel (PNL))  ONCE - STAT,   STAT     12/03/18 1831   12/03/18 1832  Ferritin  (Anemia Panel (PNL))  ONCE - STAT,   STAT     12/03/18 1831          Vitals/Pain Today's Vitals   12/03/18 1629 12/03/18 1656 12/03/18 1908  BP:  134/64 124/69  Pulse:  69 71  Resp:  18 18  Temp:  98.3 F (36.8 C)   TempSrc:  Oral   SpO2:  100% 100%  PainSc: 0-No pain      Isolation Precautions No active isolations  Medications Medications  0.9 %  sodium chloride infusion (Manually program via Guardrails IV Fluids) (has no administration in time range)    Mobility walks Low fall risk   Focused Assessments Cardiac Assessment Handoff:    No results found for: CKTOTAL, CKMB, CKMBINDEX, TROPONINI No results found for: DDIMER Does the Patient currently have chest pain? No      R Recommendations: See Admitting Provider Note  Report given to:  [facility] RN  Additional Notes:  Patient very difficult stick. Just placed 2nd IV access for blood transfusion. Waiting for blood bank to call with blood ready. Consent signed for transfusion and at bedside.

## 2018-12-03 NOTE — H&P (Signed)
History and Physical    Harold Garner HQI:[id number] DOB: [birth year] DOA: 12/03/2018  PCP: Maurice Small, MD   Patient coming from: Home.  Chief Complaint: Low hemoglobin.  HPI: Harold Garner is a 68 y.o. male with history of CAD status post CABG, hypertension, hyperlipidemia has been experiencing weakness and fatigue over the last few days with exertional dyspnea.  Had gone to his PCP and found hemoglobin to be around 5.  Patient denies noticing any black stools or blood in the stools.  Denies any chest pain.  Denies any use of NSAIDs.  Has not had any endoscopy or colonoscopy previously.  ED Course: In the ER patient's hemoglobin was found to be around 4.5 and ferritin level was 3.  Stool for occult blood was negative.  Units of PRBC transfusion has been ordered and admitted for further observation.  Review of Systems: As per HPI, rest all negative.   Past Medical History:  Diagnosis Date  . Coronary artery disease    status post coronary artery bypass grafting October 2008 by Dr. Arvid Right   . GERD (gastroesophageal reflux disease)   . History of heart attack   . Hyperlipidemia    intolerant to statin drugs  . Hypertension   . Myocardial infarction [facility])     Past Surgical History:  Procedure Laterality Date  . CARDIAC CATHETERIZATION  06/25/2007   LAD 90%, RCA 80%, 3 vessel disease, moderate LV dysfunction.  . CORONARY ARTERY BYPASS GRAFT  06/2007   Dr Cyndia Bent  . HAND SURGERY Left   . NM MYOCAR PERF WALL MOTION  06/25/2011   protocol:Lexiscan, post stress EF 59%, low risk scan  . TONSILLECTOMY     childhood  . TRANSTHORACIC ECHOCARDIOGRAM  08/27/2007   normal Echo     reports that he has been smoking cigars. He has never used smokeless tobacco. He reports current alcohol use. He reports that he does not use drugs.  Allergies  Allergen Reactions  . Darvon [Propoxyphene Hcl] Itching  . Statins Other (See Comments)    Has tried pravachol, crestor and lipitor     Family History  Problem Relation Age of Onset  . Heart attack Mother   . Colon cancer Neg Hx     Prior to Admission medications   Medication Sig Start Date End Date Taking? Authorizing Provider  aspirin 81 MG tablet Take 81 mg by mouth every morning.    Yes [provider]  esomeprazole (NEXIUM) 20 MG capsule Take 20 mg by mouth daily.    Yes [provider]  Evolocumab (REPATHA SURECLICK) 132 MG/ML SOAJ Inject 140 mg into the skin every 14 (fourteen) days. 02/14/16  Yes Lorretta Harp, MD  metoprolol succinate (TOPROL-XL) 25 MG 24 hr tablet TAKE ONE TABLET BY MOUTH DAILY . PLEASE CONTACT OFFICE FOR ADDITIONAL REFILLS Patient taking differently: Take 25 mg by mouth daily.  04/13/18  Yes Lorretta Harp, MD  Multiple Vitamin (MULTIVITAMIN) tablet Take 1 tablet by mouth daily.   Yes [provider]    Physical Exam: Vitals:   12/03/18 1930 12/03/18 2044 12/03/18 2136 12/03/18 2154  BP: 126/65 140/74 137/67 136/71  Pulse: 70 64 62 66  Resp: (!) 25 (!) 22 (!) 22 20  Temp:  98.7 F (37.1 C) 98.5 F (36.9 C) 98.5 F (36.9 C)  TempSrc:  Oral Oral Oral  SpO2: 96% 98% 100% 100%  Weight:  115.1 kg    Height:  6\' 3"  (1.905 m)  Constitutional: Moderately built and nourished. Vitals:   12/03/18 1930 12/03/18 2044 12/03/18 2136 12/03/18 2154  BP: 126/65 140/74 137/67 136/71  Pulse: 70 64 62 66  Resp: (!) 25 (!) 22 (!) 22 20  Temp:  98.7 F (37.1 C) 98.5 F (36.9 C) 98.5 F (36.9 C)  TempSrc:  Oral Oral Oral  SpO2: 96% 98% 100% 100%  Weight:  115.1 kg    Height:  6\' 3"  (1.905 m)     Eyes: Mild pallor no icterus. ENMT: No discharge from the ears eyes nose or mouth. Neck: No mass felt.  No neck rigidity. Respiratory: No rhonchi or crepitations. Cardiovascular: S1-S2 heard. Abdomen: Soft nontender bowel sounds present. Musculoskeletal: No edema.  No joint effusion. Skin: No rash. Neurologic: Alert awake oriented to time place and person.   Moves all extremities. Psychiatric: Appears normal.  Normal affect.   Labs on Admission: I have personally reviewed following labs and imaging studies  CBC: Recent Labs  Lab 12/03/18 1705  WBC 5.7  HGB 4.5*  HCT 19.0*  MCV 68.6*  PLT 366*   Basic Metabolic Panel: Recent Labs  Lab 12/03/18 1705  NA 137  K 4.0  CL 106  CO2 22  GLUCOSE 117*  BUN 12  CREATININE 0.91  CALCIUM 9.0   GFR: Estimated Creatinine Clearance: 104.8 mL/min (by C-G formula based on SCr of 0.91 mg/dL). Liver Function Tests: No results for input(s): AST, ALT, ALKPHOS, BILITOT, PROT, ALBUMIN in the last 168 hours. No results for input(s): LIPASE, AMYLASE in the last 168 hours. No results for input(s): AMMONIA in the last 168 hours. Coagulation Profile: No results for input(s): INR, PROTIME in the last 168 hours. Cardiac Enzymes: No results for input(s): CKTOTAL, CKMB, CKMBINDEX, TROPONINI in the last 168 hours. BNP (last 3 results) No results for input(s): PROBNP in the last 8760 hours. HbA1C: No results for input(s): HGBA1C in the last 72 hours. CBG: No results for input(s): GLUCAP in the last 168 hours. Lipid Profile: No results for input(s): CHOL, HDL, LDLCALC, TRIG, CHOLHDL, LDLDIRECT in the last 72 hours. Thyroid Function Tests: No results for input(s): TSH, T4TOTAL, FREET4, T3FREE, THYROIDAB in the last 72 hours. Anemia Panel: Recent Labs    12/03/18 1901  VITAMINB12 383  FOLATE 18.9  FERRITIN 3*  TIBC 496*  IRON 9*  RETICCTPCT 1.4   Urine analysis:    Component Value Date/Time   COLORURINE YELLOW 06/30/2007 1019   APPEARANCEUR CLEAR 06/30/2007 1019   LABSPEC 1.010 06/30/2007 1019   PHURINE 7.5 06/30/2007 1019   GLUCOSEU NEGATIVE 06/30/2007 1019   HGBUR NEGATIVE 06/30/2007 1019   BILIRUBINUR NEGATIVE 06/30/2007 1019   KETONESUR NEGATIVE 06/30/2007 1019   PROTEINUR NEGATIVE 06/30/2007 1019   UROBILINOGEN 0.2 06/30/2007 1019   NITRITE NEGATIVE 06/30/2007 1019    LEUKOCYTESUR  06/30/2007 1019    NEGATIVE MICROSCOPIC NOT DONE ON URINES WITH NEGATIVE PROTEIN, BLOOD, LEUKOCYTES, NITRITE, OR GLUCOSE <1000 mg/dL.   Sepsis Labs: @LABRCNTIP (procalcitonin:4,lacticidven:4) )No results found for this or any previous visit (from the past 240 hour(s)).   Radiological Exams on Admission: No results found.   Assessment/Plan Principal Problem:   Symptomatic anemia Active Problems:   Essential hypertension   Hyperlipidemia   Coronary artery disease    1. Symptomatic severe iron deficiency anemia -2 units of packed red blood cell transfusion has been ordered.  Follow CBC after transfusion.  Patient is at this time declining any colonoscopy or EGD. 2. History of CAD status post CABG on metoprolol  aspirin and takes Repatha for hyperlipidemia. 3. Hypertension on metoprolol. 4. Hyperlipidemia on Repatha.   5. History of GERD on PPI.   DVT prophylaxis: SCDs. Code Status: Full code. Family Communication: Discussed with patient. Disposition Plan: Home. Consults called: None. Admission status: Observation.   Rise Patience MD [facility] Pager [phone].  If 7PM-7AM, please contact night-coverage [email or web address] Password [facility]  12/03/2018, 10:32 PM

## 2018-12-03 NOTE — ED Triage Notes (Signed)
Pt here from PCP with low hemoglobin.  Told it was 5.  Sent here for blood transfusion.  A&Ox4, ambulatory with no sob.

## 2018-12-03 NOTE — ED Notes (Signed)
Blood transfusion consent obtained by this RN.

## 2018-12-04 ENCOUNTER — Observation Stay (HOSPITAL_COMMUNITY): Payer: Medicare HMO

## 2018-12-04 DIAGNOSIS — E785 Hyperlipidemia, unspecified: Secondary | ICD-10-CM | POA: Diagnosis present

## 2018-12-04 DIAGNOSIS — F17290 Nicotine dependence, other tobacco product, uncomplicated: Secondary | ICD-10-CM | POA: Diagnosis present

## 2018-12-04 DIAGNOSIS — Z7982 Long term (current) use of aspirin: Secondary | ICD-10-CM | POA: Diagnosis not present

## 2018-12-04 DIAGNOSIS — D649 Anemia, unspecified: Secondary | ICD-10-CM | POA: Diagnosis not present

## 2018-12-04 DIAGNOSIS — D1803 Hemangioma of intra-abdominal structures: Secondary | ICD-10-CM | POA: Diagnosis present

## 2018-12-04 DIAGNOSIS — I252 Old myocardial infarction: Secondary | ICD-10-CM | POA: Diagnosis not present

## 2018-12-04 DIAGNOSIS — I2510 Atherosclerotic heart disease of native coronary artery without angina pectoris: Secondary | ICD-10-CM | POA: Diagnosis present

## 2018-12-04 DIAGNOSIS — D509 Iron deficiency anemia, unspecified: Secondary | ICD-10-CM | POA: Diagnosis not present

## 2018-12-04 DIAGNOSIS — K219 Gastro-esophageal reflux disease without esophagitis: Secondary | ICD-10-CM | POA: Diagnosis present

## 2018-12-04 DIAGNOSIS — I10 Essential (primary) hypertension: Secondary | ICD-10-CM | POA: Diagnosis present

## 2018-12-04 DIAGNOSIS — Z8249 Family history of ischemic heart disease and other diseases of the circulatory system: Secondary | ICD-10-CM | POA: Diagnosis not present

## 2018-12-04 DIAGNOSIS — Z951 Presence of aortocoronary bypass graft: Secondary | ICD-10-CM | POA: Diagnosis not present

## 2018-12-04 LAB — BASIC METABOLIC PANEL WITH GFR
Anion gap: 6 (ref 5–15)
BUN: 11 mg/dL (ref 8–23)
CO2: 24 mmol/L (ref 22–32)
Calcium: 8.8 mg/dL — ABNORMAL LOW (ref 8.9–10.3)
Chloride: 108 mmol/L (ref 98–111)
Creatinine, Ser: 0.8 mg/dL (ref 0.61–1.24)
GFR calc Af Amer: >60 mL/min
GFR calc non Af Amer: >60 mL/min
Glucose, Bld: 123 mg/dL — ABNORMAL HIGH (ref 70–99)
Potassium: 3.9 mmol/L (ref 3.5–5.1)
Sodium: 138 mmol/L (ref 135–145)

## 2018-12-04 LAB — CBC
HCT: 21.2 % — ABNORMAL LOW (ref 39.0–52.0)
HCT: 21.2 % — ABNORMAL LOW (ref 39.0–52.0)
Hemoglobin: 5.8 g/dL — CL (ref 13.0–17.0)
Hemoglobin: 6 g/dL — CL (ref 13.0–17.0)
MCH: 19.5 pg — ABNORMAL LOW (ref 26.0–34.0)
MCH: 20 pg — ABNORMAL LOW (ref 26.0–34.0)
MCHC: 27.4 g/dL — ABNORMAL LOW (ref 30.0–36.0)
MCHC: 28.3 g/dL — ABNORMAL LOW (ref 30.0–36.0)
MCV: 71.4 fL — ABNORMAL LOW (ref 80.0–100.0)
MCV: 70.7 fL — ABNORMAL LOW (ref 80.0–100.0)
NRBC: 0.4 % — AB (ref 0.0–0.2)
Platelets: 376 10*3/uL (ref 150–400)
Platelets: 373 10*3/uL (ref 150–400)
RBC: 2.97 MIL/uL — ABNORMAL LOW (ref 4.22–5.81)
RBC: 3 MIL/uL — ABNORMAL LOW (ref 4.22–5.81)
RDW: 19.9 % — ABNORMAL HIGH (ref 11.5–15.5)
RDW: 19.8 % — ABNORMAL HIGH (ref 11.5–15.5)
WBC: 5.9 10*3/uL (ref 4.0–10.5)
WBC: 5.6 10*3/uL (ref 4.0–10.5)
nRBC: 0.3 % — ABNORMAL HIGH (ref 0.0–0.2)

## 2018-12-04 LAB — HEMOGLOBIN AND HEMATOCRIT, BLOOD
HEMATOCRIT: 29.2 % — AB (ref 39.0–52.0)
Hemoglobin: 8.5 g/dL — ABNORMAL LOW (ref 13.0–17.0)

## 2018-12-04 LAB — PREPARE RBC (CROSSMATCH)

## 2018-12-04 LAB — HIV ANTIBODY (ROUTINE TESTING W REFLEX): HIV Screen 4th Generation wRfx: NONREACTIVE

## 2018-12-04 MED ORDER — SODIUM CHLORIDE 0.9% IV SOLUTION
Freq: Once | INTRAVENOUS | Status: AC
  Administered 2018-12-04: 09:00:00 via INTRAVENOUS

## 2018-12-04 MED ORDER — IOHEXOL 300 MG/ML  SOLN
100.0000 mL | Freq: Once | INTRAMUSCULAR | Status: AC | PRN
  Administered 2018-12-04: 100 mL via INTRAVENOUS

## 2018-12-04 NOTE — ED Provider Notes (Signed)
[facility] PRO[facility]ESSIVE CARE Provider Note   CSN: [id number] Arrival date & time: 12/03/18  1623    History   Chief Complaint Chief Complaint  Patient presents with   Abnormal Lab    HPI Harold Garner is a 68 y.o. male.     HPI   68yo male with history of CAD on ASA, htn, hlpd, presents with concern for fatigue and anemia found on outpatient labs.   Reports he had URI symptoms and received abx in January and since then he has not felt well. Reports continuing fatigue since the illness. Denies chest pain, dyspnea, lightheadedness. Denies black or bloody stool, hematemesis, hematuria. Denies recent fevers.  Took nexium and pepcid but denies having significant abdominal symptoms. Has not had a colonoscopy. No trauma or back pain.  Past Medical History:  Diagnosis Date   Coronary artery disease    status post coronary artery bypass grafting October 2008 by Dr. Arvid Right    GERD (gastroesophageal reflux disease)    History of heart attack    Hyperlipidemia    intolerant to statin drugs   Hypertension    Myocardial infarction [facility])     Patient Active Problem List   Diagnosis Date Noted   Symptomatic anemia 12/03/2018   Essential hypertension 07/13/2013   Hyperlipidemia 07/13/2013   Coronary artery disease 07/13/2013    Past Surgical History:  Procedure Laterality Date   CARDIAC CATHETERIZATION  06/25/2007   LAD 90%, RCA 80%, 3 vessel disease, moderate LV dysfunction.   CORONARY ARTERY BYPASS GRAFT  06/2007   Dr Cyndia Bent   HAND SURGERY Left    NM MYOCAR PERF WALL MOTION  06/25/2011   protocol:Lexiscan, post stress EF 59%, low risk scan   TONSILLECTOMY     childhood   TRANSTHORACIC ECHOCARDIOGRAM  08/27/2007   normal Echo        Home Medications    Prior to Admission medications   Medication Sig Start Date End Date Taking? Authorizing Provider  aspirin 81 MG tablet Take 81 mg by mouth every morning.    Yes [provider]    esomeprazole (NEXIUM) 20 MG capsule Take 20 mg by mouth daily.    Yes [provider]  Evolocumab (REPATHA SURECLICK) 283 MG/ML SOAJ Inject 140 mg into the skin every 14 (fourteen) days. 02/14/16  Yes Lorretta Harp, MD  metoprolol succinate (TOPROL-XL) 25 MG 24 hr tablet TAKE ONE TABLET BY MOUTH DAILY . PLEASE CONTACT OFFICE FOR ADDITIONAL REFILLS Patient taking differently: Take 25 mg by mouth daily.  04/13/18  Yes Lorretta Harp, MD  Multiple Vitamin (MULTIVITAMIN) tablet Take 1 tablet by mouth daily.   Yes [provider]    Family History Family History  Problem Relation Age of Onset   Heart attack Mother    Colon cancer Neg Hx     Social History Social History   Tobacco Use   Smoking status: Light Tobacco Smoker    Types: Cigars   Smokeless tobacco: Never Used   Tobacco comment: chew on them mostly  Substance Use Topics   Alcohol use: Yes   Drug use: No     Allergies   Darvon [propoxyphene hcl] and Statins   Review of Systems Review of Systems  Constitutional: Positive for fatigue. Negative for fever.  Eyes: Negative for visual disturbance.  Respiratory: Negative for shortness of breath.   Cardiovascular: Negative for chest pain.  Gastrointestinal: Negative for abdominal pain, blood in stool, diarrhea, nausea and vomiting.  Genitourinary: Negative for difficulty urinating.  Musculoskeletal: Negative for back pain.  Skin: Negative for rash.  Neurological: Negative for syncope and light-headedness.     Physical Exam Updated Vital Signs BP 138/74    Pulse (!) 57    Temp 98.2 F (36.8 C) (Oral)    Resp 18    Ht 6\' 3"  (1.905 m)    Wt 115.1 kg    SpO2 100%    BMI 31.72 kg/m   Physical Exam Vitals signs and nursing note reviewed.  Constitutional:      General: He is not in acute distress.    Appearance: He is well-developed. He is not diaphoretic.  HENT:     Head: Normocephalic and atraumatic.  Eyes:     Conjunctiva/sclera:  Conjunctivae normal.  Neck:     Musculoskeletal: Normal range of motion.  Cardiovascular:     Rate and Rhythm: Normal rate and regular rhythm.     Heart sounds: Normal heart sounds. No murmur. No friction rub. No gallop.   Pulmonary:     Effort: Pulmonary effort is normal. No respiratory distress.     Breath sounds: Normal breath sounds. No wheezing or rales.  Abdominal:     General: There is no distension.     Palpations: Abdomen is soft.     Tenderness: There is no abdominal tenderness. There is no guarding.  Genitourinary:    Rectum: External hemorrhoid present.  Skin:    General: Skin is warm and dry.  Neurological:     Mental Status: He is alert and oriented to person, place, and time.      ED Treatments / Results  Labs (all labs ordered are listed, but only abnormal results are displayed) Labs Reviewed  CBC - Abnormal; Notable for the following components:      Result Value   RBC 2.77 (*)    Hemoglobin 4.5 (*)    HCT 19.0 (*)    MCV 68.6 (*)    MCH 16.2 (*)    MCHC 23.7 (*)    RDW 17.8 (*)    Platelets 450 (*)    All other components within normal limits  BASIC METABOLIC PANEL - Abnormal; Notable for the following components:   Glucose, Bld 117 (*)    All other components within normal limits  IRON AND TIBC - Abnormal; Notable for the following components:   Iron 9 (*)    TIBC 496 (*)    Saturation Ratios 2 (*)    All other components within normal limits  FERRITIN - Abnormal; Notable for the following components:   Ferritin 3 (*)    All other components within normal limits  RETICULOCYTES - Abnormal; Notable for the following components:   RBC. 2.63 (*)    Immature Retic Fract 22.2 (*)    All other components within normal limits  BASIC METABOLIC PANEL - Abnormal; Notable for the following components:   Glucose, Bld 123 (*)    Calcium 8.8 (*)    All other components within normal limits  CBC - Abnormal; Notable for the following components:   RBC 2.97  (*)    Hemoglobin 5.8 (*)    HCT 21.2 (*)    MCV 71.4 (*)    MCH 19.5 (*)    MCHC 27.4 (*)    RDW 19.9 (*)    nRBC 0.3 (*)    All other components within normal limits  CBC - Abnormal; Notable for the following components:   RBC 3.00 (*)  Hemoglobin 6.0 (*)    HCT 21.2 (*)    MCV 70.7 (*)    MCH 20.0 (*)    MCHC 28.3 (*)    RDW 19.8 (*)    nRBC 0.4 (*)    All other components within normal limits  VITAMIN B12  FOLATE  HIV ANTIBODY (ROUTINE TESTING W REFLEX)  POC OCCULT BLOOD, ED  TYPE AND SCREEN  PREPARE RBC (CROSSMATCH)  PREPARE RBC (CROSSMATCH)    EKG None  Radiology Ct Abdomen Pelvis W Contrast  Result Date: 12/04/2018 CLINICAL DATA:  Iron deficiency anemia. EXAM: CT ABDOMEN AND PELVIS WITH CONTRAST TECHNIQUE: Multidetector CT imaging of the abdomen and pelvis was performed using the standard protocol following bolus administration of intravenous contrast. CONTRAST:  121mL OMNIPAQUE IOHEXOL 300 MG/ML  SOLN COMPARISON:  09/27/2013. Multiple previous noncontrast CT studies. MRI abdomen 01/29/2009. FINDINGS: Lower chest: Cardiomegaly. Lung bases are clear. Moderate size hiatal hernia. Hepatobiliary: The patient is known to have multiple hemangiomas at the dome of the liver. Elsewhere, there is 1.7 cm low-density area in the caudal tip of the right lobe which did not appear to represent a hemangioma on the MRI of 2010. Therefore, this is viewed with some concern. Pancreas: Normal Spleen: Normal Adrenals/Urinary Tract: Adrenal glands show multiple chronic adenomas. No enlarging or worrisome finding. 2 mm nonobstructing stone in the upper pole of the left kidney. Small lateral renal cyst on the left. 2 mm nonobstructing stone in the lower pole of the right kidney. 1 cm renal cyst at the inferior right kidney. Bladder appears normal. Stomach/Bowel: Hiatal hernia as noted above. No evidence of bowel obstruction, mass or acute inflammation. The patient has mild diverticulosis without  evidence of diverticulitis. Vascular/Lymphatic: Aortic atherosclerosis. No aneurysm. IVC is normal. No retroperitoneal adenopathy. Reproductive: Normal Other: No free fluid or air. Musculoskeletal: No significant bone finding. Chronic hemangioma within T12. IMPRESSION: No lesions seen to explain iron deficiency anemia. No discernible bowel mass by CT. Multiple liver lesions consistent with hemangiomas, evaluated on multiple previous noncontrast CTs and one prior liver MRI. There is a single 1.7 cm low-density focus in the caudal tip of the right lobe which can not be shown to represent a hemangioma based on the previous MRI. For that reason, one could consider either abdominal ultrasound or repeat abdominal MRI to specifically evaluate that finding. Electronically Signed   By: Nelson Chimes M.D.   On: 12/04/2018 12:30    Procedures .Critical Care Performed by: Gareth Niedermeier, MD Authorized by: Gareth Shorey, MD   Critical care provider statement:    Critical care time (minutes):  30   Critical care was necessary to treat or prevent imminent or life-threatening deterioration of the following conditions:  Circulatory failure   Critical care was time spent personally by me on the following activities:  Ordering and review of laboratory studies, ordering and performing treatments and interventions, development of treatment plan with patient or surrogate and examination of patient   (including critical care time)  Medications Ordered in ED Medications  metoprolol succinate (TOPROL-XL) 24 hr tablet 25 mg (has no administration in time range)  pantoprazole (PROTONIX) EC tablet 40 mg (40 mg Oral Given 12/04/18 1024)  acetaminophen (TYLENOL) tablet 650 mg (has no administration in time range)    Or  acetaminophen (TYLENOL) suppository 650 mg (has no administration in time range)  ondansetron (ZOFRAN) tablet 4 mg (has no administration in time range)    Or  ondansetron (ZOFRAN) injection 4 mg (has  no  administration in time range)  0.9 %  sodium chloride infusion (Manually program via Guardrails IV Fluids) (10 mLs Intravenous New Bag/Given 12/03/18 2123)  0.9 %  sodium chloride infusion (Manually program via Guardrails IV Fluids) ( Intravenous New Bag/Given 12/04/18 0912)  iohexol (OMNIPAQUE) 300 MG/ML solution 100 mL (100 mLs Intravenous Contrast Given 12/04/18 1200)     Initial Impression / Assessment and Plan / ED Course  I have reviewed the triage vital signs and the nursing notes.  Pertinent labs & imaging results that were available during my care of the patient were reviewed by me and considered in my medical decision making (see chart for details).        68yo male with history of CAD on ASA, htn, hlpd, presents with concern for fatigue and anemia found on outpatient labs.  Hemoglobin 4.5 here and on outpatient labs. He is hemodynamically stable. Normal stool color on exam. No hx to suggest acute bleeding.  Ordered anemia panel and transfusion of 2U pRBC. Admitted to hospitalist for further care.   Final Clinical Impressions(s) / ED Diagnoses   Final diagnoses:  Symptomatic anemia    ED Discharge Orders    None       Gareth Chou, MD 12/04/18 1255

## 2018-12-04 NOTE — Progress Notes (Signed)
PROGRESS NOTE    Harold Garner  WNU:[id number] DOB: 1951-04-07 DOA: 12/03/2018 PCP: Maurice Small, MD   Brief Narrative: Harold Garner is Harold Garner 68 y.o. male with history of CAD status post CABG, hypertension, hyperlipidemia has been experiencing weakness and fatigue over the last few days with exertional dyspnea.  Had gone to his PCP and found hemoglobin to be around 5.  Patient denies noticing any black stools or blood in the stools.  Denies any chest pain.  Denies any use of NSAIDs.  Has not had any endoscopy or colonoscopy previously.  Assessment & Plan:   Principal Problem:   Symptomatic anemia Active Problems:   Essential hypertension   Hyperlipidemia   Coronary artery disease   Iron deficiency anemia   1. Symptomatic severe iron deficiency anemia  1. Unclear etiology at this point 2. Labs suggestive of IDA with ferritin 3.  Normal B12, folate. 3. Retics hypoproliferative 4. Follow CBC with diff tomorrow 5. Discussed importance of endoscopic evaluation given his life threatening anemia.  Pt prefers to discuss with with PCP as outpatient.   6. Hb 6 this AM.  Transfuse additional 2 units pRBC.  (4 total) 7. Will need outpatient GI follow up as well as potentially heme follow up. 8. CT abdomen pelvis without lesions to explain IDA.  No discernible bowel mass.  Has hemangiomas seen in prior imaging.  1.7 cm low density focus of R lobe needs follow up imaging.  Discussed with pt.  2. History of CAD status post CABG on metoprolol aspirin and takes Repatha for hyperlipidemia.  No CP, ctm. 3. Hypertension on metoprolol.  Stable, follow. 4. Hyperlipidemia on Repatha.  Stable. 5. History of GERD on PPI.  Stable.  DVT prophylaxis: SCD Code Status: full  Family Communication: wife at bedside Disposition Plan: continue inpatient due to life threatening anemia requiring continued transfusion and monitoring to ensure stable H/H post transfusion.   Consultants:   none  Procedures:    none  Antimicrobials: Anti-infectives (From admission, onward)   None         Subjective: Wife at bedside. Feels better after transfusion. Doesn't want endoscopic evaluation, wants to discuss with PCP.  Objective: Vitals:   12/04/18 1350 12/04/18 1418 12/04/18 1634 12/04/18 1710  BP: 121/62 131/68 135/66 131/72  Pulse: 71 70 (!) 52 (!) 55  Resp: 18 18 18 18   Temp: 98 F (36.7 C) 98.1 F (36.7 C) 98.2 F (36.8 C) 97.7 F (36.5 C)  TempSrc: Oral Oral Oral Oral  SpO2: 99% 99% 100%   Weight:      Height:        Intake/Output Summary (Last 24 hours) at 12/04/2018 1811 Last data filed at 12/04/2018 1308 Gross per 24 hour  Intake 831.67 ml  Output --  Net 831.67 ml   Filed Weights   12/03/18 2044  Weight: 115.1 kg    Examination:  General exam: Appears calm and comfortable  Pale conjunctiva Respiratory system: Clear to auscultation. Respiratory effort normal. Cardiovascular system: S1 & S2 heard, RRR.  Gastrointestinal system: Abdomen is nondistended, soft and nontender.  Central nervous system: Alert and oriented. No focal neurological deficits. Extremities: no LEE Skin: No rashes, lesions or ulcers Psychiatry: Judgement and insight appear normal. Mood & affect appropriate.     Data Reviewed: I have personally reviewed following labs and imaging studies  CBC: Recent Labs  Lab 12/03/18 1705 12/04/18 0359 12/04/18 0717  WBC 5.7 5.9 5.6  HGB 4.5* 5.8* 6.0*  HCT  19.0* 21.2* 21.2*  MCV 68.6* 71.4* 70.7*  PLT 450* 376 585   Basic Metabolic Panel: Recent Labs  Lab 12/03/18 1705 12/04/18 0359  NA 137 138  K 4.0 3.9  CL 106 108  CO2 22 24  GLUCOSE 117* 123*  BUN 12 11  CREATININE 0.91 0.80  CALCIUM 9.0 8.8*   GFR: Estimated Creatinine Clearance: 119.2 mL/min (by C-G formula based on SCr of 0.8 mg/dL). Liver Function Tests: No results for input(s): AST, ALT, ALKPHOS, BILITOT, PROT, ALBUMIN in the last 168 hours. No results for input(s):  LIPASE, AMYLASE in the last 168 hours. No results for input(s): AMMONIA in the last 168 hours. Coagulation Profile: No results for input(s): INR, PROTIME in the last 168 hours. Cardiac Enzymes: No results for input(s): CKTOTAL, CKMB, CKMBINDEX, TROPONINI in the last 168 hours. BNP (last 3 results) No results for input(s): PROBNP in the last 8760 hours. HbA1C: No results for input(s): HGBA1C in the last 72 hours. CBG: No results for input(s): GLUCAP in the last 168 hours. Lipid Profile: No results for input(s): CHOL, HDL, LDLCALC, TRIG, CHOLHDL, LDLDIRECT in the last 72 hours. Thyroid Function Tests: No results for input(s): TSH, T4TOTAL, FREET4, T3FREE, THYROIDAB in the last 72 hours. Anemia Panel: Recent Labs    12/03/18 1901  VITAMINB12 383  FOLATE 18.9  FERRITIN 3*  TIBC 496*  IRON 9*  RETICCTPCT 1.4   Sepsis Labs: No results for input(s): PROCALCITON, LATICACIDVEN in the last 168 hours.  No results found for this or any previous visit (from the past 240 hour(s)).       Radiology Studies: Ct Abdomen Pelvis W Contrast  Result Date: 12/04/2018 CLINICAL DATA:  Iron deficiency anemia. EXAM: CT ABDOMEN AND PELVIS WITH CONTRAST TECHNIQUE: Multidetector CT imaging of the abdomen and pelvis was performed using the standard protocol following bolus administration of intravenous contrast. CONTRAST:  168mL OMNIPAQUE IOHEXOL 300 MG/ML  SOLN COMPARISON:  09/27/2013. Multiple previous noncontrast CT studies. MRI abdomen 01/29/2009. FINDINGS: Lower chest: Cardiomegaly. Lung bases are clear. Moderate size hiatal hernia. Hepatobiliary: The patient is known to have multiple hemangiomas at the dome of the liver. Elsewhere, there is 1.7 cm low-density area in the caudal tip of the right lobe which did not appear to represent Marda Breidenbach hemangioma on the MRI of 2010. Therefore, this is viewed with some concern. Pancreas: Normal Spleen: Normal Adrenals/Urinary Tract: Adrenal glands show multiple chronic  adenomas. No enlarging or worrisome finding. 2 mm nonobstructing stone in the upper pole of the left kidney. Small lateral renal cyst on the left. 2 mm nonobstructing stone in the lower pole of the right kidney. 1 cm renal cyst at the inferior right kidney. Bladder appears normal. Stomach/Bowel: Hiatal hernia as noted above. No evidence of bowel obstruction, mass or acute inflammation. The patient has mild diverticulosis without evidence of diverticulitis. Vascular/Lymphatic: Aortic atherosclerosis. No aneurysm. IVC is normal. No retroperitoneal adenopathy. Reproductive: Normal Other: No free fluid or air. Musculoskeletal: No significant bone finding. Chronic hemangioma within T12. IMPRESSION: No lesions seen to explain iron deficiency anemia. No discernible bowel mass by CT. Multiple liver lesions consistent with hemangiomas, evaluated on multiple previous noncontrast CTs and one prior liver MRI. There is Glover Capano single 1.7 cm low-density focus in the caudal tip of the right lobe which can not be shown to represent Marice Angelino hemangioma based on the previous MRI. For that reason, one could consider either abdominal ultrasound or repeat abdominal MRI to specifically evaluate that finding. Electronically Signed  By: Nelson Chimes M.D.   On: 12/04/2018 12:30        Scheduled Meds:  metoprolol succinate  25 mg Oral Daily   pantoprazole  40 mg Oral Daily   Continuous Infusions:   LOS: 0 days    Time spent: over 30 min    Fayrene Helper, MD [facility] Pager AMION  If 7PM-7AM, please contact night-coverage [email or web address] Password [facility] 12/04/2018, 6:11 PM

## 2018-12-04 NOTE — Care Management Obs Status (Signed)
[facility] NOTIFICATION   Patient Details  Name: DMONI FORTSON MRN: [id number] Date of Birth: 12-Nov-1950   Medicare Observation Status Notification Given:  Yes    Carles Collet, RN 12/04/2018, 3:43 PM

## 2018-12-05 LAB — CBC WITH DIFFERENTIAL/PLATELET
Abs Immature Granulocytes: 0.03 10*3/uL (ref 0.00–0.07)
BASOS ABS: 0.1 10*3/uL (ref 0.0–0.1)
Basophils Relative: 1 %
Eosinophils Absolute: 0.3 10*3/uL (ref 0.0–0.5)
Eosinophils Relative: 5 %
HCT: 28.9 % — ABNORMAL LOW (ref 39.0–52.0)
Hemoglobin: 8.3 g/dL — ABNORMAL LOW (ref 13.0–17.0)
Immature Granulocytes: 1 %
Lymphocytes Relative: 22 %
Lymphs Abs: 1.4 10*3/uL (ref 0.7–4.0)
MCH: 21.2 pg — ABNORMAL LOW (ref 26.0–34.0)
MCHC: 28.7 g/dL — ABNORMAL LOW (ref 30.0–36.0)
MCV: 73.9 fL — ABNORMAL LOW (ref 80.0–100.0)
Monocytes Absolute: 0.8 10*3/uL (ref 0.1–1.0)
Monocytes Relative: 12 %
NRBC: 0.3 % — AB (ref 0.0–0.2)
Neutro Abs: 3.8 10*3/uL (ref 1.7–7.7)
Neutrophils Relative %: 59 %
Platelets: 354 10*3/uL (ref 150–400)
RBC: 3.91 MIL/uL — AB (ref 4.22–5.81)
RDW: 20.1 % — AB (ref 11.5–15.5)
WBC: 6.4 10*3/uL (ref 4.0–10.5)

## 2018-12-05 LAB — COMPREHENSIVE METABOLIC PANEL
ALT: 9 U/L (ref 0–44)
AST: 13 U/L — ABNORMAL LOW (ref 15–41)
Albumin: 2.9 g/dL — ABNORMAL LOW (ref 3.5–5.0)
Alkaline Phosphatase: 77 U/L (ref 38–126)
Anion gap: 6 (ref 5–15)
BILIRUBIN TOTAL: 0.7 mg/dL (ref 0.3–1.2)
BUN: 7 mg/dL — ABNORMAL LOW (ref 8–23)
CO2: 24 mmol/L (ref 22–32)
Calcium: 8.5 mg/dL — ABNORMAL LOW (ref 8.9–10.3)
Chloride: 110 mmol/L (ref 98–111)
Creatinine, Ser: 0.93 mg/dL (ref 0.61–1.24)
GFR calc Af Amer: >60 mL/min (ref >60)
GFR calc non Af Amer: >60 mL/min (ref >60)
Glucose, Bld: 105 mg/dL — ABNORMAL HIGH (ref 70–99)
Potassium: 4.2 mmol/L (ref 3.5–5.1)
Sodium: 140 mmol/L (ref 135–145)
TOTAL PROTEIN: 5.3 g/dL — AB (ref 6.5–8.1)

## 2018-12-05 MED ORDER — FERROUS SULFATE 325 (65 FE) MG PO TABS: 325.0000 mg | ORAL_TABLET | Freq: Every day | ORAL | 0 refills | Status: DC

## 2018-12-05 NOTE — Discharge Summary (Signed)
Physician Discharge Summary  Harold Garner AVW:[id number] DOB: [birth year] DOA: 12/03/2018  PCP: Maurice Small, MD  Admit date: 12/03/2018 Discharge date: 12/05/2018  Time spent: 33 minutes  Recommendations for Outpatient Follow-up:  1. Follow outpatient CBC/CMP 2. Follow up with gastroenterology as outpatient  3. Consider heme follow up outpatient 4. Started on PO iron, consider outpatient iron infusion 5. Follow up imaging outpatient (needs further liver imaging as noted below for 1.7 cm low density focus)   Discharge Diagnoses:  Principal Problem:   Symptomatic anemia Active Problems:   Essential hypertension   Hyperlipidemia   Coronary artery disease   Iron deficiency anemia   Discharge Condition: stable  Diet recommendation: heart healthy  Filed Weights   12/03/18 2044  Weight: 115.1 kg    History of present illness:  Harold Garner Harold Garner 68 y.o.malewithhistory of CAD status post CABG, hypertension, hyperlipidemia has been experiencing weakness and fatigue over the last few days with exertional dyspnea. Had gone to his PCP and found hemoglobin to be around 5. Patient denies noticing any black stools or blood in the stools. Denies any chest pain. Denies any use of NSAIDs. Has not had any endoscopy or colonoscopy previously.  See below for additional details  Hospital Course:  1. Symptomatic severe iron deficiency anemia 1. Unclear etiology at this point 2. Labs suggestive of IDA with ferritin 3.  Normal B12, folate. 3. Retics hypoproliferative 4. Stable and appropriate after 4 units pRBC 5. Discussed importance of endoscopic evaluation given his life threatening anemia.  Pt prefers to discuss with with PCP as outpatient.   6. Hb 8.3 today, stable 7. Will need outpatient GI follow up as well as potentially heme follow up. 8. CT abdomen pelvis without lesions to explain IDA.  No discernible bowel mass.  Has hemangiomas seen in prior imaging.  1.7 cm low  density focus of R lobe needs follow up imaging.  Discussed with pt.  2. History of CAD status post CABG on metoprolol aspirin and takes Repatha for hyperlipidemia.  No CP, ctm. 3. Hypertension on metoprolol.  Stable, follow. 4. Hyperlipidemia on Repatha. Stable. 5. History of GERD on PPI.  Stable.  Procedures:  none  Consultations:  none  Discharge Exam: Vitals:   12/04/18 2334 12/05/18 0333  BP: (!) 112/52 (!) 128/59  Pulse: (!) 59 (!) 55  Resp: 18 18  Temp: 98 F (36.7 C) 98.3 F (36.8 C)  SpO2: 97% 97%   Feeling bettre. Discussed importance of outpatient follow up with pt and need for GI endoscopic evaluation.  He's never had colonoscopy. Pt does not like hospitals and his wife notes this is part of the reason he is eager for discharge. Recommended Gi follow up outpatient.  Discussed imaging and need for outpatient follow up  General: No acute distress. Cardiovascular: Heart sounds show Harold Garner regular rate, and rhythm. Lungs: Clear to auscultation bilaterally Abdomen: Soft, nontender, nondistended  Neurological: Alert and oriented 3. Moves all extremities 4. Cranial nerves II through XII grossly intact. Skin: Warm and dry. No rashes or lesions. Extremities: No clubbing or cyanosis. No edema.  Psychiatric: Mood and affect are normal. Insight and judgment are stable.   Discharge Instructions   Discharge Instructions    Call MD for:  difficulty breathing, headache or visual disturbances   Complete by:  As directed    Call MD for:  extreme fatigue   Complete by:  As directed    Call MD for:  hives   Complete by:  As directed    Call MD for:  persistant dizziness or light-headedness   Complete by:  As directed    Call MD for:  persistant nausea and vomiting   Complete by:  As directed    Call MD for:  redness, tenderness, or signs of infection (pain, swelling, redness, odor or green/yellow discharge around incision site)   Complete by:  As directed    Call MD  for:  severe uncontrolled pain   Complete by:  As directed    Call MD for:  temperature >100.4   Complete by:  As directed    Diet - low sodium heart healthy   Complete by:  As directed    Discharge instructions   Complete by:  As directed    You came in for low blood counts (anemia).  You have iron deficiency anemia.  The cause for this is currently unclear.    We transfused you with 4 units of blood and have started iron for you to take as an outpatient.  Continued evaluation as an outpatient with gastroenterology for endoscopy is recommended.  You may also benefit from hematology consultation as an outpatient.   You need outpatient follow up for your imaging.  You had known hemangiomas on your imaging, but there was Harold Garner spot that needs further evaluation.  Watch for signs or symptoms of bleeding.   Please arrange follow up with your PCP within Harold Garner few days to recheck labs and discuss referral to gastroenterology.    Return for new, recurrent, or worsening symptoms.  Please ask your PCP to request records from this hospitalization so they know what was done and what the next steps will be.   Increase activity slowly   Complete by:  As directed      Allergies as of 12/05/2018      Reactions   Darvon [propoxyphene Hcl] Itching   Statins Other (See Comments)   Has tried pravachol, crestor and lipitor      Medication List    TAKE these medications   aspirin 81 MG tablet Take 81 mg by mouth every morning.   esomeprazole 20 MG capsule Commonly known as:  NEXIUM Take 20 mg by mouth daily.   Evolocumab 140 MG/ML Soaj Commonly known as:  Repatha SureClick Inject 258 mg into the skin every 14 (fourteen) days.   ferrous sulfate 325 (65 FE) MG tablet Take 1 tablet (325 mg total) by mouth daily for 30 days.   metoprolol succinate 25 MG 24 hr tablet Commonly known as:  TOPROL-XL TAKE ONE TABLET BY MOUTH DAILY . PLEASE CONTACT OFFICE FOR ADDITIONAL REFILLS What changed:  See the  new instructions.   multivitamin tablet Take 1 tablet by mouth daily.      Allergies  Allergen Reactions  . Darvon [Propoxyphene Hcl] Itching  . Statins Other (See Comments)    Has tried pravachol, crestor and lipitor      The results of significant diagnostics from this hospitalization (including imaging, microbiology, ancillary and laboratory) are listed below for reference.    Significant Diagnostic Studies: Ct Abdomen Pelvis W Contrast  Result Date: 12/04/2018 CLINICAL DATA:  Iron deficiency anemia. EXAM: CT ABDOMEN AND PELVIS WITH CONTRAST TECHNIQUE: Multidetector CT imaging of the abdomen and pelvis was performed using the standard protocol following bolus administration of intravenous contrast. CONTRAST:  165mL OMNIPAQUE IOHEXOL 300 MG/ML  SOLN COMPARISON:  09/27/2013. Multiple previous noncontrast CT studies. MRI abdomen 01/29/2009. FINDINGS: Lower chest: Cardiomegaly. Lung bases are clear. Moderate  size hiatal hernia. Hepatobiliary: The patient is known to have multiple hemangiomas at the dome of the liver. Elsewhere, there is 1.7 cm low-density area in the caudal tip of the right lobe which did not appear to represent Rye Decoste hemangioma on the MRI of 2010. Therefore, this is viewed with some concern. Pancreas: Normal Spleen: Normal Adrenals/Urinary Tract: Adrenal glands show multiple chronic adenomas. No enlarging or worrisome finding. 2 mm nonobstructing stone in the upper pole of the left kidney. Small lateral renal cyst on the left. 2 mm nonobstructing stone in the lower pole of the right kidney. 1 cm renal cyst at the inferior right kidney. Bladder appears normal. Stomach/Bowel: Hiatal hernia as noted above. No evidence of bowel obstruction, mass or acute inflammation. The patient has mild diverticulosis without evidence of diverticulitis. Vascular/Lymphatic: Aortic atherosclerosis. No aneurysm. IVC is normal. No retroperitoneal adenopathy. Reproductive: Normal Other: No free fluid or  air. Musculoskeletal: No significant bone finding. Chronic hemangioma within T12. IMPRESSION: No lesions seen to explain iron deficiency anemia. No discernible bowel mass by CT. Multiple liver lesions consistent with hemangiomas, evaluated on multiple previous noncontrast CTs and one prior liver MRI. There is Suraj Ramdass single 1.7 cm low-density focus in the caudal tip of the right lobe which can not be shown to represent Kerissa Coia hemangioma based on the previous MRI. For that reason, one could consider either abdominal ultrasound or repeat abdominal MRI to specifically evaluate that finding. Electronically Signed   By: Nelson Chimes M.D.   On: 12/04/2018 12:30    Microbiology: No results found for this or any previous visit (from the past 240 hour(s)).   Labs: Basic Metabolic Panel: Recent Labs  Lab 12/03/18 1705 12/04/18 0359 12/05/18 0503  NA 137 138 140  K 4.0 3.9 4.2  CL 106 108 110  CO2 22 24 24   GLUCOSE 117* 123* 105*  BUN 12 11 7*  CREATININE 0.91 0.80 0.93  CALCIUM 9.0 8.8* 8.5*   Liver Function Tests: Recent Labs  Lab 12/05/18 0503  AST 13*  ALT 9  ALKPHOS 77  BILITOT 0.7  PROT 5.3*  ALBUMIN 2.9*   No results for input(s): LIPASE, AMYLASE in the last 168 hours. No results for input(s): AMMONIA in the last 168 hours. CBC: Recent Labs  Lab 12/03/18 1705 12/04/18 0359 12/04/18 0717 12/04/18 1905 12/05/18 0503  WBC 5.7 5.9 5.6  --  6.4  NEUTROABS  --   --   --   --  3.8  HGB 4.5* 5.8* 6.0* 8.5* 8.3*  HCT 19.0* 21.2* 21.2* 29.2* 28.9*  MCV 68.6* 71.4* 70.7*  --  73.9*  PLT 450* 376 373  --  354   Cardiac Enzymes: No results for input(s): CKTOTAL, CKMB, CKMBINDEX, TROPONINI in the last 168 hours. BNP: BNP (last 3 results) No results for input(s): BNP in the last 8760 hours.  ProBNP (last 3 results) No results for input(s): PROBNP in the last 8760 hours.  CBG: No results for input(s): GLUCAP in the last 168 hours.     Signed:  Fayrene Helper MD.  [facility] 12/05/2018, 8:31 AM

## 2018-12-05 NOTE — Progress Notes (Signed)
Pt given discharge summary and was discharged via wife as transportation.

## 2018-12-07 LAB — BPAM RBC
Blood Product Expiration Date: 202003252359
Blood Product Expiration Date: 202003262359
Blood Product Expiration Date: 202003262359
Blood Product Expiration Date: 202004082359
Blood Product Expiration Date: 202004142359
ISSUE DATE / TIME: 202003132132
ISSUE DATE / TIME: 202003140026
ISSUE DATE / TIME: 202003140856
ISSUE DATE / TIME: 202003141357
Unit Type and Rh: 600
Unit Type and Rh: 600
Unit Type and Rh: 600
Unit Type and Rh: 600
Unit Type and Rh: 600

## 2018-12-07 LAB — TYPE AND SCREEN
ABO/RH(D): A NEG
Antibody Screen: NEGATIVE
Unit division: 0
Unit division: 0
Unit division: 0
Unit division: 0
Unit division: 0

## 2018-12-13 DIAGNOSIS — Z09 Encounter for follow-up examination after completed treatment for conditions other than malignant neoplasm: Secondary | ICD-10-CM | POA: Diagnosis not present

## 2018-12-13 DIAGNOSIS — D509 Iron deficiency anemia, unspecified: Secondary | ICD-10-CM | POA: Diagnosis not present

## 2019-01-10 ENCOUNTER — Other Ambulatory Visit: Payer: Self-pay

## 2019-01-10 MED ORDER — METOPROLOL SUCCINATE ER 25 MG PO TB24: ORAL_TABLET | ORAL | 10 refills | Status: DC

## 2019-01-11 ENCOUNTER — Other Ambulatory Visit: Payer: Self-pay | Admitting: Family Medicine

## 2019-01-11 DIAGNOSIS — R9389 Abnormal findings on diagnostic imaging of other specified body structures: Secondary | ICD-10-CM

## 2019-01-14 ENCOUNTER — Ambulatory Visit
Admission: RE | Admit: 2019-01-14 | Discharge: 2019-01-14 | Disposition: A | Discharge status: Home / Self Care | Payer: Medicare HMO | Source: Ambulatory Visit | Attending: Family Medicine | Admitting: Family Medicine

## 2019-01-14 ENCOUNTER — Other Ambulatory Visit: Payer: Self-pay

## 2019-01-14 DIAGNOSIS — R9389 Abnormal findings on diagnostic imaging of other specified body structures: Secondary | ICD-10-CM | POA: Diagnosis not present

## 2019-03-14 DIAGNOSIS — S70362A Insect bite (nonvenomous), left thigh, initial encounter: Secondary | ICD-10-CM | POA: Diagnosis not present

## 2019-03-30 DIAGNOSIS — D1801 Hemangioma of skin and subcutaneous tissue: Secondary | ICD-10-CM | POA: Diagnosis not present

## 2019-03-30 DIAGNOSIS — L570 Actinic keratosis: Secondary | ICD-10-CM | POA: Diagnosis not present

## 2019-03-30 DIAGNOSIS — L821 Other seborrheic keratosis: Secondary | ICD-10-CM | POA: Diagnosis not present

## 2019-03-30 DIAGNOSIS — L853 Xerosis cutis: Secondary | ICD-10-CM | POA: Diagnosis not present

## 2019-03-30 DIAGNOSIS — L820 Inflamed seborrheic keratosis: Secondary | ICD-10-CM | POA: Diagnosis not present

## 2019-03-30 DIAGNOSIS — C44622 Squamous cell carcinoma of skin of right upper limb, including shoulder: Secondary | ICD-10-CM | POA: Diagnosis not present

## 2019-03-30 DIAGNOSIS — D692 Other nonthrombocytopenic purpura: Secondary | ICD-10-CM | POA: Diagnosis not present

## 2019-03-30 DIAGNOSIS — L814 Other melanin hyperpigmentation: Secondary | ICD-10-CM | POA: Diagnosis not present

## 2019-04-01 DIAGNOSIS — C44622 Squamous cell carcinoma of skin of right upper limb, including shoulder: Secondary | ICD-10-CM | POA: Diagnosis not present

## 2019-04-01 DIAGNOSIS — Z85828 Personal history of other malignant neoplasm of skin: Secondary | ICD-10-CM | POA: Diagnosis not present

## 2019-05-20 ENCOUNTER — Telehealth: Payer: Self-pay

## 2019-05-20 NOTE — Telephone Encounter (Signed)
8/28 [employer] statin request form faxed to [employer] at [phone] stating pt on repatha

## 2019-05-27 ENCOUNTER — Encounter: Payer: Self-pay | Admitting: Cardiovascular Disease

## 2019-05-31 ENCOUNTER — Ambulatory Visit: Payer: Medicare HMO | Admitting: Cardiovascular Disease

## 2019-05-31 ENCOUNTER — Other Ambulatory Visit: Payer: Self-pay

## 2019-05-31 ENCOUNTER — Encounter: Payer: Self-pay | Admitting: Cardiovascular Disease

## 2019-05-31 VITALS — BP 138/86 | HR 59 | Ht 75.0 in | Wt 259.6 lb

## 2019-05-31 DIAGNOSIS — I2510 Atherosclerotic heart disease of native coronary artery without angina pectoris: Secondary | ICD-10-CM

## 2019-05-31 DIAGNOSIS — G720 Drug-induced myopathy: Secondary | ICD-10-CM

## 2019-05-31 DIAGNOSIS — I10 Essential (primary) hypertension: Secondary | ICD-10-CM | POA: Diagnosis not present

## 2019-05-31 DIAGNOSIS — E782 Mixed hyperlipidemia: Secondary | ICD-10-CM

## 2019-05-31 DIAGNOSIS — Z008 Encounter for other general examination: Secondary | ICD-10-CM | POA: Diagnosis not present

## 2019-05-31 DIAGNOSIS — T466X5A Adverse effect of antihyperlipidemic and antiarteriosclerotic drugs, initial encounter: Secondary | ICD-10-CM

## 2019-05-31 LAB — HEPATIC FUNCTION PANEL
ALT: 10 IU/L (ref 0–44)
AST: 15 IU/L (ref 0–40)
Albumin: 3.9 g/dL (ref 3.8–4.8)
Alkaline Phosphatase: 90 IU/L (ref 39–117)
Bilirubin Total: 0.7 mg/dL (ref 0.0–1.2)
Bilirubin, Direct: 0.19 mg/dL (ref 0.00–0.40)
Total Protein: 6 g/dL (ref 6.0–8.5)

## 2019-05-31 LAB — LIPID PANEL
Chol/HDL Ratio: 3.9 ratio (ref 0.0–5.0)
Cholesterol, Total: 154 mg/dL (ref 100–199)
HDL: 40 mg/dL (ref >39)
LDL Chol Calc (NIH): 89 mg/dL (ref 0–99)
Triglycerides: 140 mg/dL (ref 0–149)
VLDL Cholesterol Cal: 25 mg/dL (ref 5–40)

## 2019-05-31 LAB — CBC
Hematocrit: 49.9 % (ref 37.5–51.0)
Hemoglobin: 16.5 g/dL (ref 13.0–17.7)
MCH: 31.5 pg (ref 26.6–33.0)
MCHC: 33.1 g/dL (ref 31.5–35.7)
MCV: 95 fL (ref 79–97)
Platelets: 185 x10E3/uL (ref 150–450)
RBC: 5.23 x10E6/uL (ref 4.14–5.80)
RDW: 15.2 % (ref 11.6–15.4)
WBC: 4.6 x10E3/uL (ref 3.4–10.8)

## 2019-05-31 NOTE — Assessment & Plan Note (Signed)
History of essential hypertension with blood pressure measured today at 138/86.  He is on metoprolol.

## 2019-05-31 NOTE — Assessment & Plan Note (Signed)
History of CAD status post CABG 10/08 by Dr. Cyndia Bent.  Had a LIMA to his LAD, vein to the first and second marginal branches sequentially and to the PDA.  His EF initially was 35% which normalized by 2D echo 12/08.  He denies chest pain or shortness of breath.

## 2019-05-31 NOTE — Patient Instructions (Addendum)
Medication Instructions:  Your physician recommends that you continue on your current medications as directed. Please refer to the Current Medication list given to you today.  If you need a refill on your cardiac medications before your next appointment, please call your pharmacy.   Lab work: Your physician recommends that you return for lab work today: lipid and liver panels; complete blood count  If you have labs (blood work) drawn today and your tests are completely normal, you will receive your results only by: Marland Kitchen MyChart Message (if you have MyChart) OR . A paper copy in the mail If you have any lab test that is abnormal or we need to change your treatment, we will call you to review the results.  Testing/Procedures: none  Follow-Up: At [facility], you and your health needs are our priority.  As part of our continuing mission to provide you with exceptional heart care, we have created designated Provider Care Teams.  These Care Teams include your primary Cardiologist (physician) and Advanced Practice Providers (APPs -  Physician Assistants and Nurse Practitioners) who all work together to provide you with the care you need, when you need it. You will need a follow up appointment in 12 months with Dr. Quay Burow.  Please call our office 2 months in advance to schedule this/each appointment.

## 2019-05-31 NOTE — Assessment & Plan Note (Signed)
History of hyperlipidemia on Repatha with lipid profile performed 08/23/2018 revealing total cholesterol 133, LDL 74 and HDL of 40.

## 2019-05-31 NOTE — Progress Notes (Signed)
05/31/2019 Harold Garner   21-Oct-1950  [id number]  Primary Physician Maurice Small, MD Primary Cardiologist: Lorretta Harp MD FACP, [place], [place], [place]  HPI:  Harold Garner is a 68 y.o.  moderately overweight married Caucasian male with no children who I last saw  11/17/2018. He has a history of CAD status post coronary artery bypass grafting in October of 2008 by Dr. Gilford Raid. He had a LIMA to his LAD, a vein to a 1st and 2nd marginal branch sequentially, and a vein to the PDA. His EF was 35% at that time with followup echo revealing normalization of his ejection fraction 12/08. He also has hyperlipidemia, intolerant to statin drugs. He denies chest pain or shortness of breath.Because of his statin intolerance he was begun on Repatha.He has had an excellent response to Repatha.  Since I saw him in February of this year he was complaining of fatigue and shortness of breath.  He was ultimately found to be anemic and underwent transfusion.  He subsequently felt clinically improved.  He denies chest pain or shortness of breath.   Current Meds  Medication Sig  . aspirin 81 MG tablet Take 81 mg by mouth every morning.   Marland Kitchen esomeprazole (NEXIUM) 20 MG capsule Take 20 mg by mouth daily.   . Evolocumab (REPATHA SURECLICK) [id number] MG/ML SOAJ Inject 140 mg into the skin every 14 (fourteen) days.  . ferrous sulfate 325 (65 FE) MG tablet Take 1 tablet (325 mg total) by mouth daily for 30 days.  . metoprolol succinate (TOPROL-XL) 25 MG 24 hr tablet TAKE ONE TABLET BY MOUTH DAILY . PLEASE CONTACT OFFICE FOR ADDITIONAL REFILLS  . Multiple Vitamin (MULTIVITAMIN) tablet Take 1 tablet by mouth daily.     Allergies  Allergen Reactions  . Darvon [Propoxyphene Hcl] Itching  . Statins Other (See Comments)    Has tried pravachol, crestor and lipitor    Social History   Socioeconomic History  . Marital status: Married    Spouse name: Not on file  . Number of children: Not on file  . Years  of education: Not on file  . Highest education level: Not on file  Occupational History  . Not on file  Social Needs  . Financial resource strain: Not on file  . Food insecurity    Worry: Not on file    Inability: Not on file  . Transportation needs    Medical: Not on file    Non-medical: Not on file  Tobacco Use  . Smoking status: Light Tobacco Smoker    Types: Cigars  . Smokeless tobacco: Never Used  . Tobacco comment: chew on them mostly  Substance and Sexual Activity  . Alcohol use: Yes  . Drug use: No  . Sexual activity: Not on file  Lifestyle  . Physical activity    Days per week: Not on file    Minutes per session: Not on file  . Stress: Not on file  Relationships  . Social Herbalist on phone: Not on file    Gets together: Not on file    Attends religious service: Not on file    Active member of club or organization: Not on file    Attends meetings of clubs or organizations: Not on file    Relationship status: Not on file  . Intimate partner violence    Fear of current or ex partner: Not on file    Emotionally abused: Not on file  Physically abused: Not on file    Forced sexual activity: Not on file  Other Topics Concern  . Not on file  Social History Narrative  . Not on file     Review of Systems: General: negative for chills, fever, night sweats or weight changes.  Cardiovascular: negative for chest pain, dyspnea on exertion, edema, orthopnea, palpitations, paroxysmal nocturnal dyspnea or shortness of breath Dermatological: negative for rash Respiratory: negative for cough or wheezing Urologic: negative for hematuria Abdominal: negative for nausea, vomiting, diarrhea, bright red blood per rectum, melena, or hematemesis Neurologic: negative for visual changes, syncope, or dizziness All other systems reviewed and are otherwise negative except as noted above.    Blood pressure 138/86, pulse (!) 59, height 6\' 3"  (1.905 m), weight 259 lb 9.6  oz (117.8 kg), SpO2 96 %.  General appearance: alert and no distress Neck: no adenopathy, no carotid bruit, no JVD, supple, symmetrical, trachea midline and thyroid not enlarged, symmetric, no tenderness/mass/nodules Lungs: clear to auscultation bilaterally Heart: regular rate and rhythm, S1, S2 normal, no murmur, click, rub or gallop Extremities: extremities normal, atraumatic, no cyanosis or edema Pulses: 2+ and symmetric Skin: Skin color, texture, turgor normal. No rashes or lesions Neurologic: Alert and oriented X 3, normal strength and tone. Normal symmetric reflexes. Normal coordination and gait  EKG sinus bradycardia 59 with PACs.  Personally reviewed this EKG.  Complexes canceled a few occasions  ASSESSMENT AND PLAN:   Essential hypertension History of essential hypertension with blood pressure measured today at 138/86.  He is on metoprolol.  Hyperlipidemia History of hyperlipidemia on Repatha with lipid profile performed 08/23/2018 revealing total cholesterol 133, LDL 74 and HDL of 40.  Coronary artery disease History of CAD status post CABG 10/08 by Dr. Cyndia Bent.  Had a LIMA to his LAD, vein to the first and second marginal branches sequentially and to the PDA.  His EF initially was 35% which normalized by 2D echo 12/08.  He denies chest pain or shortness of breath.      Lorretta Harp MD FACP,FACC,FAHA, [facility] 05/31/2019 8:12 AM

## 2019-06-10 ENCOUNTER — Encounter: Payer: Self-pay | Admitting: *Deleted

## 2019-06-20 DIAGNOSIS — Z961 Presence of intraocular lens: Secondary | ICD-10-CM | POA: Diagnosis not present

## 2019-06-20 DIAGNOSIS — H40012 Open angle with borderline findings, low risk, left eye: Secondary | ICD-10-CM | POA: Diagnosis not present

## 2019-06-20 DIAGNOSIS — H5213 Myopia, bilateral: Secondary | ICD-10-CM | POA: Diagnosis not present

## 2019-07-09 IMAGING — US ULTRASOUND ABDOMEN LIMITED
1 series · 13 of 25 positions shown · non-contrast
Comparison: CT Abdomen and Pelvis 12/04/2018 and earlier.

CLINICAL DATA: 68-year-old male with CT Abdomen and Pelvis in [REDACTED]
revealing multiple liver lesions, most of which were demonstrated to
be benign hemangioma on prior CT and MRI, but a solitary 17 mm area
in the caudal aspect of the right lobe which has not been previously
characterized.

EXAM:
ULTRASOUND ABDOMEN LIMITED RIGHT UPPER QUADRANT

[Series 1: ultrasound abdomen limited · 0.25mm/px · 62 acquisitions, 13 frames shown]
[im 1/62]
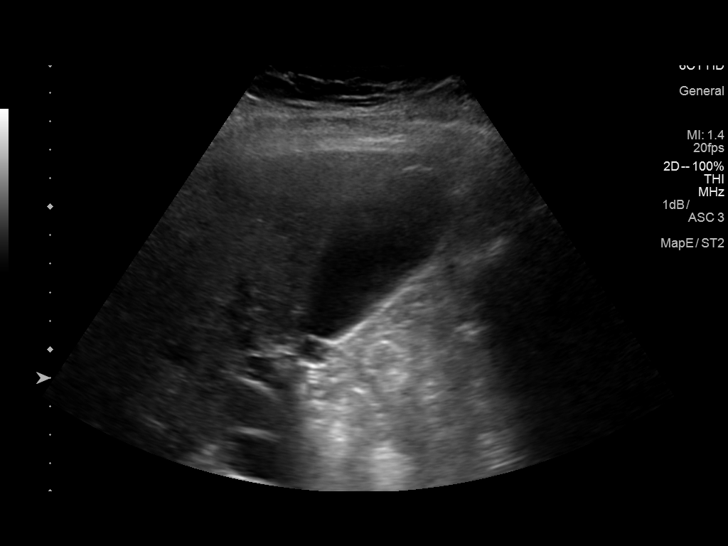
[im 6/62]
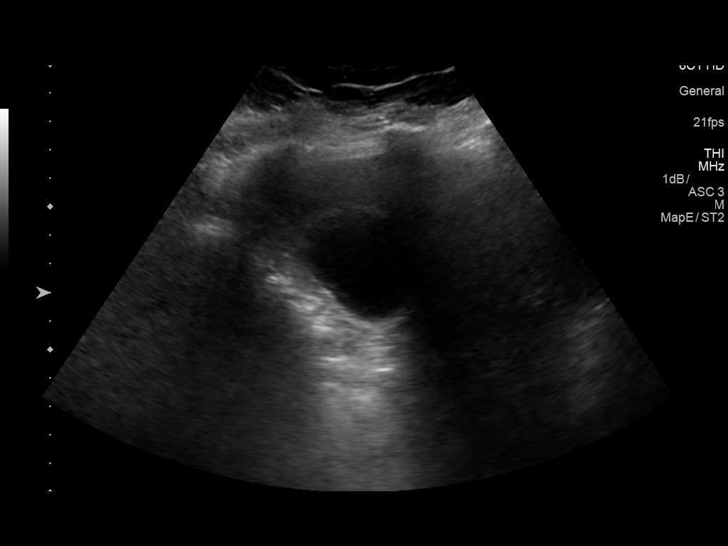
[im 11/62]
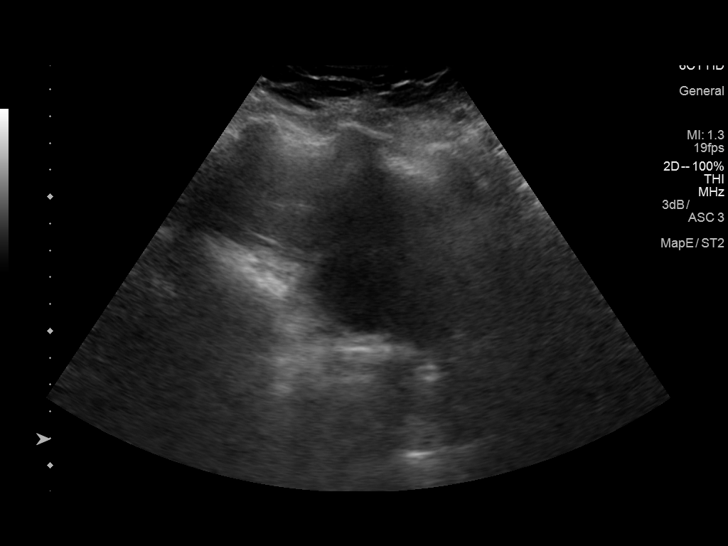
[im 16/62]
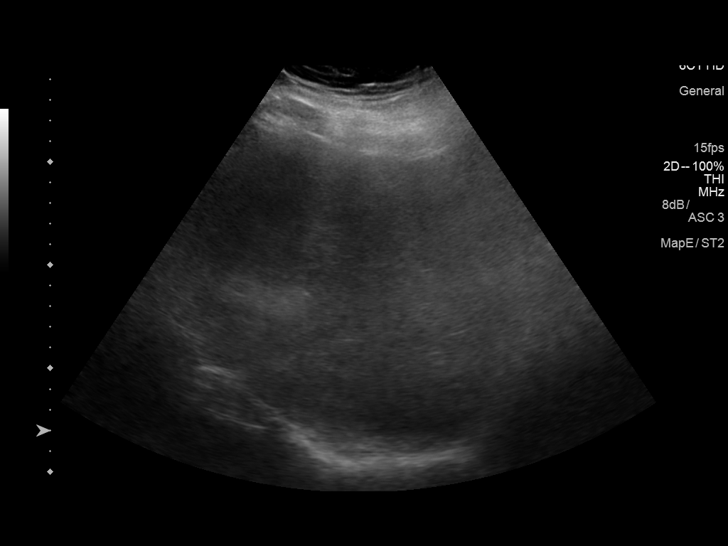
[im 21/62]
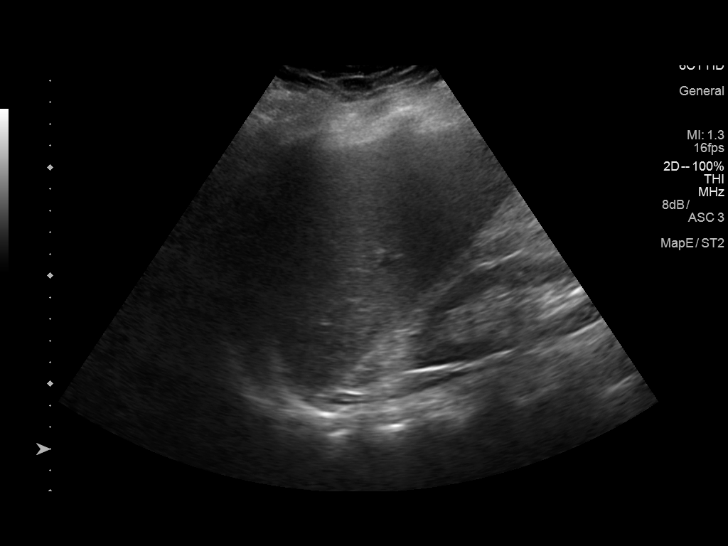
[im 26/62]
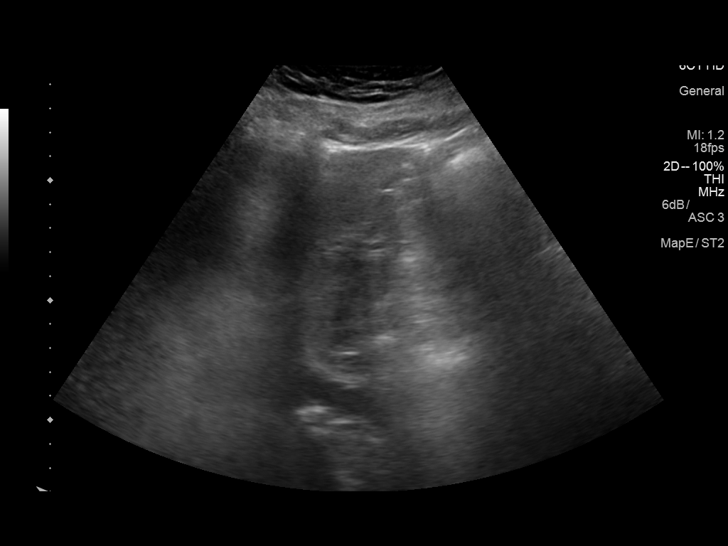
[im 31/62]
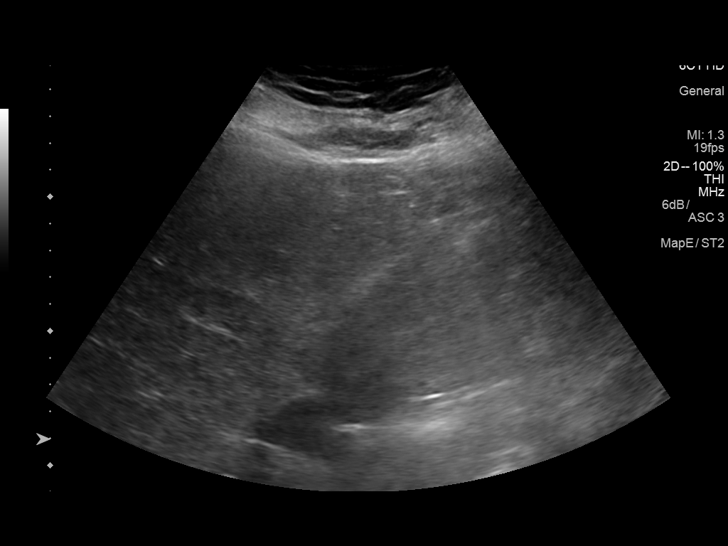
[im 36/62]
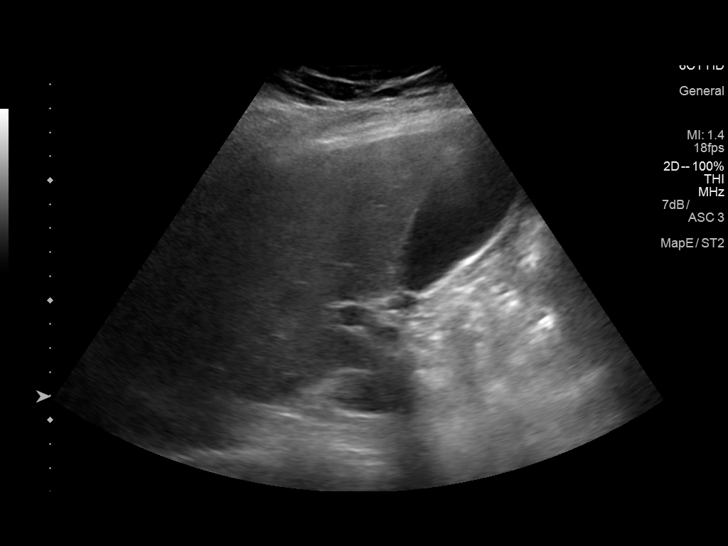
[im 41/62]
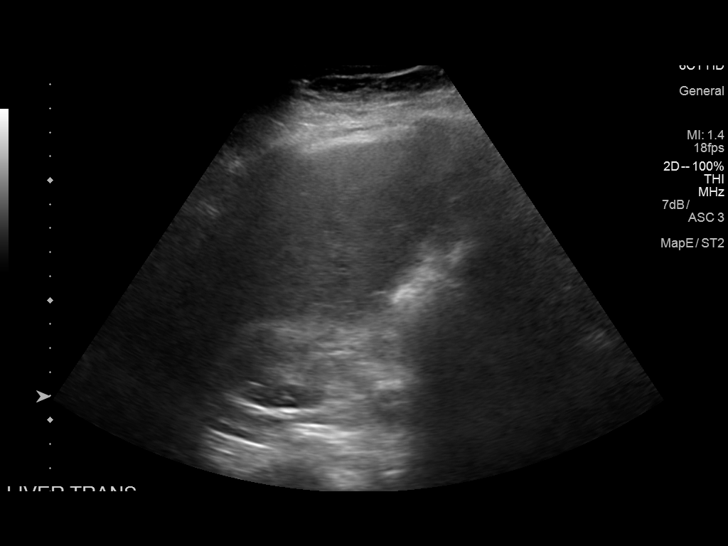
[im 46/62]
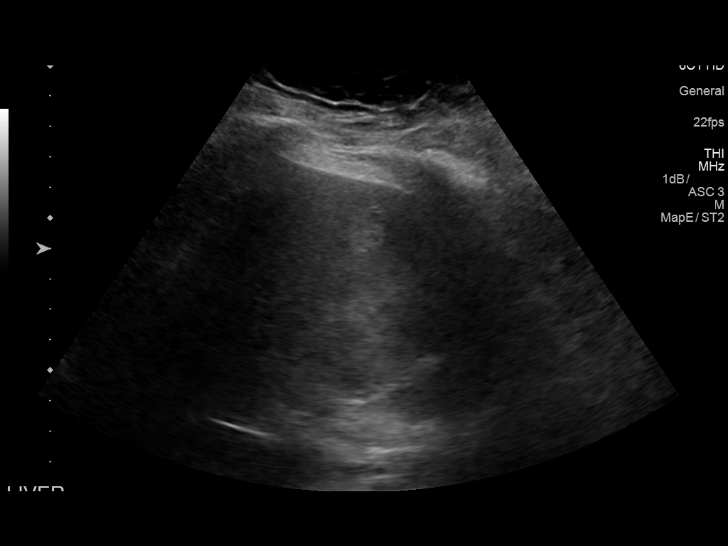
[im 51/62]
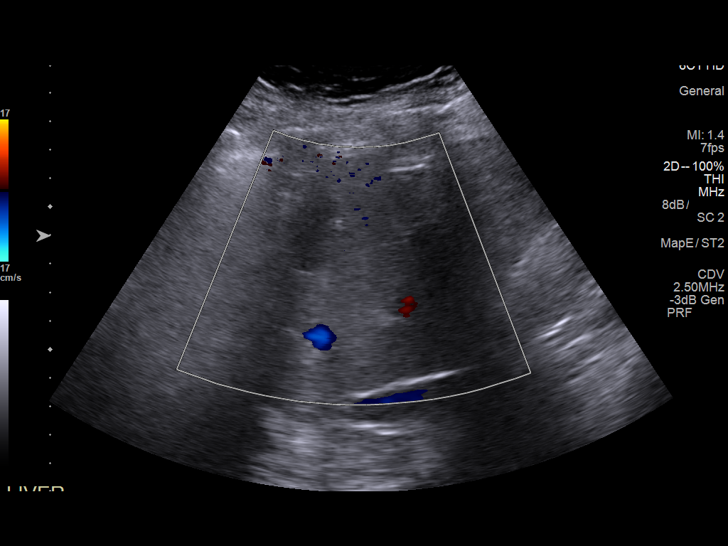
[im 56/62]
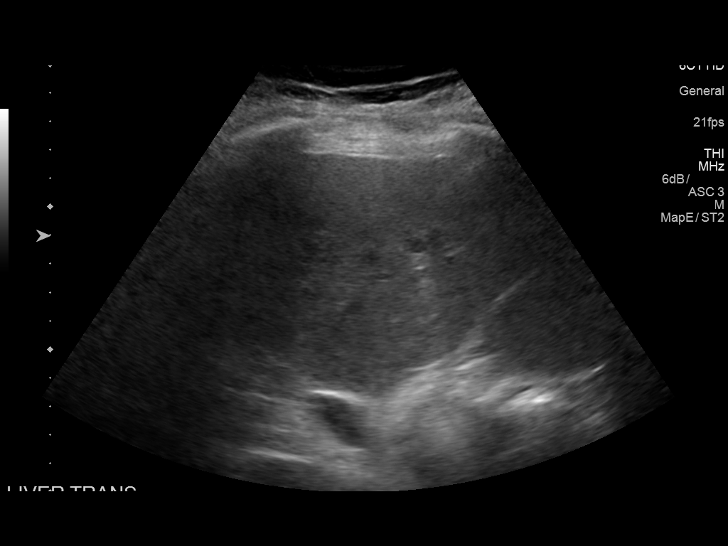
[im 62/62]
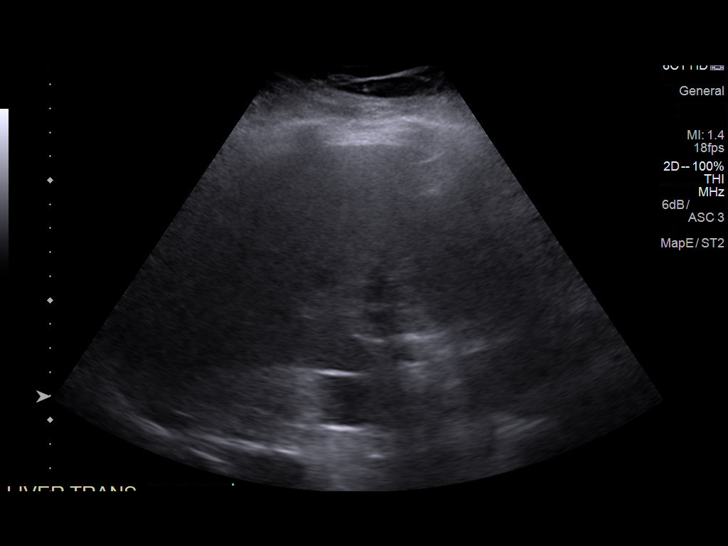

[13 of 25 positions shown; findings below may reference images not displayed]

FINDINGS: Gallbladder:

No gallstones or wall thickening visualized. No sonographic Murphy
sign noted by sonographer.

Common bile duct:

Diameter: 6 millimeters, normal.

Liver:

A 12 millimeter round echogenic focus is identified in the inferior
right hepatic lobe at the level of the right kidney on image 47.
This is a typical appearance of benign hemangioma.

The other liver lesions which are primarily located at the dome are
better demonstrated by CT than ultrasound due to proximity to the
diaphragm. Portal vein is patent on color Doppler imaging with
normal direction of blood flow towards the liver.

Other findings: Negative visible right kidney.
IMPRESSION: 1. Small 12 mm benign hemangioma in the inferior right hepatic lobe
felt to correspond to the indeterminate hypodense area described on
the 12/04/2018 CT Abdomen and Pelvis.
2. Other benign hepatic hemangiomas in the liver dome are better
demonstrated by CT.
3. Negative gallbladder, no evidence of bile duct obstruction.

## 2019-07-12 DIAGNOSIS — G720 Drug-induced myopathy: Secondary | ICD-10-CM | POA: Diagnosis not present

## 2019-07-12 DIAGNOSIS — J3489 Other specified disorders of nose and nasal sinuses: Secondary | ICD-10-CM | POA: Diagnosis not present

## 2019-07-13 DIAGNOSIS — R05 Cough: Secondary | ICD-10-CM | POA: Diagnosis not present

## 2019-08-15 DIAGNOSIS — R109 Unspecified abdominal pain: Secondary | ICD-10-CM | POA: Diagnosis not present

## 2019-08-15 DIAGNOSIS — N202 Calculus of kidney with calculus of ureter: Secondary | ICD-10-CM | POA: Diagnosis not present

## 2019-08-19 DIAGNOSIS — R69 Illness, unspecified: Secondary | ICD-10-CM | POA: Diagnosis not present

## 2019-09-01 DIAGNOSIS — T466X5A Adverse effect of antihyperlipidemic and antiarteriosclerotic drugs, initial encounter: Secondary | ICD-10-CM | POA: Insufficient documentation

## 2019-09-01 DIAGNOSIS — G720 Drug-induced myopathy: Secondary | ICD-10-CM | POA: Insufficient documentation

## 2019-09-07 ENCOUNTER — Telehealth: Payer: Self-pay

## 2019-09-07 NOTE — Telephone Encounter (Signed)
lmomed the pt to apply for [employer] since [employer] will only allow uninsured people to receive it free from them.

## 2019-09-08 DIAGNOSIS — N202 Calculus of kidney with calculus of ureter: Secondary | ICD-10-CM | POA: Diagnosis not present

## 2019-09-12 ENCOUNTER — Telehealth: Payer: Self-pay | Admitting: Cardiovascular Disease

## 2019-09-12 NOTE — Telephone Encounter (Signed)
repatha °

## 2019-09-12 NOTE — Telephone Encounter (Signed)
Called and spoke w/pt regarding applying for [employer] and the pt stated that they would apply tomorrow

## 2019-09-12 NOTE — Telephone Encounter (Signed)
Patient needs to speak to pharmacist about her husband Repatha.

## 2019-09-28 NOTE — Telephone Encounter (Signed)
This encounter was created in error - please disregard.

## 2019-09-29 ENCOUNTER — Telehealth: Payer: Self-pay

## 2019-09-29 NOTE — Telephone Encounter (Signed)
LMOMED TO APPLY FOR [employer]

## 2019-10-06 ENCOUNTER — Telehealth: Payer: Self-pay

## 2019-10-06 NOTE — Telephone Encounter (Signed)
Called the pt to remind him to apply for hw for the 4th time

## 2019-10-10 MED ORDER — REPATHA SURECLICK 140 MG/ML ~~LOC~~ SOAJ: 140.0000 mg | SUBCUTANEOUS | 3 refills | Status: DC

## 2019-10-10 NOTE — Addendum Note (Signed)
Addended by: Allean Found on: 10/10/2019 09:47 AM   Modules accepted: Orders

## 2019-10-10 NOTE — Telephone Encounter (Signed)
Pt called an stated they were approved for the [employer], rx sent, pt voiced understanding

## 2019-10-13 ENCOUNTER — Telehealth: Payer: Self-pay

## 2019-10-13 MED ORDER — PRALUENT 150 MG/ML ~~LOC~~ SOAJ: 150.0000 mg | SUBCUTANEOUS | 11 refills | Status: DC

## 2019-10-13 NOTE — Telephone Encounter (Signed)
Called and lmomed the pt stated that they are switching to praluent, rx sent, and instructed the pt to contact [employer] and tell them that they need coverage for that med instead of the repatha.

## 2019-11-02 ENCOUNTER — Telehealth: Payer: Self-pay

## 2019-11-02 NOTE — Telephone Encounter (Signed)
lmomed the pt to call us back and let us know how the praluent is going

## 2019-11-06 ENCOUNTER — Ambulatory Visit: Payer: Medicare HMO | Attending: Internal Medicine

## 2019-11-06 DIAGNOSIS — Z23 Encounter for immunization: Secondary | ICD-10-CM | POA: Insufficient documentation

## 2019-11-06 NOTE — Progress Notes (Signed)
   Covid-19 Vaccination Clinic  Name:  Harold Garner    MRN: [id number] DOB: [birth year]  11/06/2019  Mr. Sur was observed post Covid-19 immunization for 15 minutes without incidence. He was provided with Vaccine Information Sheet and instruction to access the V-Safe system.   Mr. Bethell was instructed to call 911 with any severe reactions post vaccine: Marland Kitchen Difficulty breathing  . Swelling of your face and throat  . A fast heartbeat  . A bad rash all over your body  . Dizziness and weakness    Immunizations Administered    Name Date Dose VIS Date Route   Pfizer COVID-19 Vaccine 11/06/2019 12:19 PM 0.3 mL 09/02/2019 Intramuscular   Manufacturer: [place]   Lot: [id number]   [place]: [id number]

## 2019-11-07 DIAGNOSIS — Z961 Presence of intraocular lens: Secondary | ICD-10-CM | POA: Diagnosis not present

## 2019-11-07 DIAGNOSIS — H40052 Ocular hypertension, left eye: Secondary | ICD-10-CM | POA: Diagnosis not present

## 2019-11-07 DIAGNOSIS — H0015 Chalazion left lower eyelid: Secondary | ICD-10-CM | POA: Diagnosis not present

## 2019-11-29 ENCOUNTER — Ambulatory Visit: Payer: Medicare HMO | Attending: Internal Medicine

## 2019-11-29 DIAGNOSIS — Z23 Encounter for immunization: Secondary | ICD-10-CM | POA: Insufficient documentation

## 2019-11-29 NOTE — Progress Notes (Signed)
   Covid-19 Vaccination Clinic  Name:  Harold Garner    MRN: [id number] DOB: Feb 24, 1951  11/29/2019  Harold Garner was observed post Covid-19 immunization for 15 minutes without incident. He was provided with Vaccine Information Sheet and instruction to access the V-Safe system.   Harold Garner was instructed to call 911 with any severe reactions post vaccine: Marland Kitchen Difficulty breathing  . Swelling of face and throat  . A fast heartbeat  . A bad rash all over body  . Dizziness and weakness   Immunizations Administered    Name Date Dose VIS Date Route   Pfizer COVID-19 Vaccine 11/29/2019  4:22 PM 0.3 mL 09/02/2019 Intramuscular   Manufacturer: [place]   Lot: [id number]   [place]: [id number]

## 2019-12-04 ENCOUNTER — Other Ambulatory Visit: Payer: Self-pay | Admitting: Cardiovascular Disease

## 2020-03-01 DIAGNOSIS — H0011 Chalazion right upper eyelid: Secondary | ICD-10-CM | POA: Diagnosis not present

## 2020-03-29 DIAGNOSIS — D2271 Melanocytic nevi of right lower limb, including hip: Secondary | ICD-10-CM | POA: Diagnosis not present

## 2020-03-29 DIAGNOSIS — L814 Other melanin hyperpigmentation: Secondary | ICD-10-CM | POA: Diagnosis not present

## 2020-03-29 DIAGNOSIS — L570 Actinic keratosis: Secondary | ICD-10-CM | POA: Diagnosis not present

## 2020-03-29 DIAGNOSIS — D485 Neoplasm of uncertain behavior of skin: Secondary | ICD-10-CM | POA: Diagnosis not present

## 2020-03-29 DIAGNOSIS — Z85828 Personal history of other malignant neoplasm of skin: Secondary | ICD-10-CM | POA: Diagnosis not present

## 2020-03-29 DIAGNOSIS — D2262 Melanocytic nevi of left upper limb, including shoulder: Secondary | ICD-10-CM | POA: Diagnosis not present

## 2020-03-29 DIAGNOSIS — D2371 Other benign neoplasm of skin of right lower limb, including hip: Secondary | ICD-10-CM | POA: Diagnosis not present

## 2020-03-29 DIAGNOSIS — D225 Melanocytic nevi of trunk: Secondary | ICD-10-CM | POA: Diagnosis not present

## 2020-03-29 DIAGNOSIS — S80861A Insect bite (nonvenomous), right lower leg, initial encounter: Secondary | ICD-10-CM | POA: Diagnosis not present

## 2020-03-29 DIAGNOSIS — L821 Other seborrheic keratosis: Secondary | ICD-10-CM | POA: Diagnosis not present

## 2020-03-29 DIAGNOSIS — L218 Other seborrheic dermatitis: Secondary | ICD-10-CM | POA: Diagnosis not present

## 2020-06-08 DIAGNOSIS — H35372 Puckering of macula, left eye: Secondary | ICD-10-CM | POA: Diagnosis not present

## 2020-06-08 DIAGNOSIS — H524 Presbyopia: Secondary | ICD-10-CM | POA: Diagnosis not present

## 2020-06-08 DIAGNOSIS — Z961 Presence of intraocular lens: Secondary | ICD-10-CM | POA: Diagnosis not present

## 2020-08-05 DIAGNOSIS — R69 Illness, unspecified: Secondary | ICD-10-CM | POA: Diagnosis not present

## 2020-08-06 DIAGNOSIS — H00014 Hordeolum externum left upper eyelid: Secondary | ICD-10-CM | POA: Diagnosis not present

## 2020-08-08 ENCOUNTER — Encounter: Payer: Self-pay | Admitting: Cardiovascular Disease

## 2020-08-08 ENCOUNTER — Ambulatory Visit: Payer: Medicare HMO | Admitting: Cardiovascular Disease

## 2020-08-08 VITALS — BP 136/84 | HR 56 | Ht 75.0 in | Wt 268.0 lb

## 2020-08-08 DIAGNOSIS — D509 Iron deficiency anemia, unspecified: Secondary | ICD-10-CM | POA: Diagnosis not present

## 2020-08-08 DIAGNOSIS — I2510 Atherosclerotic heart disease of native coronary artery without angina pectoris: Secondary | ICD-10-CM | POA: Diagnosis not present

## 2020-08-08 DIAGNOSIS — I10 Essential (primary) hypertension: Secondary | ICD-10-CM

## 2020-08-08 DIAGNOSIS — E782 Mixed hyperlipidemia: Secondary | ICD-10-CM | POA: Diagnosis not present

## 2020-08-08 DIAGNOSIS — E7800 Pure hypercholesterolemia, unspecified: Secondary | ICD-10-CM

## 2020-08-08 LAB — CBC
Hematocrit: 34.4 % — ABNORMAL LOW (ref 37.5–51.0)
Hemoglobin: 10.2 g/dL — ABNORMAL LOW (ref 13.0–17.7)
MCH: 22.8 pg — ABNORMAL LOW (ref 26.6–33.0)
MCHC: 29.7 g/dL — ABNORMAL LOW (ref 31.5–35.7)
MCV: 77 fL — ABNORMAL LOW (ref 79–97)
Platelets: 271 10*3/uL (ref 150–450)
RBC: 4.48 x10E6/uL (ref 4.14–5.80)
RDW: 15.4 % (ref 11.6–15.4)
WBC: 4.7 10*3/uL (ref 3.4–10.8)

## 2020-08-08 LAB — LIPID PANEL
Chol/HDL Ratio: 2.7 ratio (ref 0.0–5.0)
Cholesterol, Total: 121 mg/dL (ref 100–199)
HDL: 45 mg/dL (ref >39)
LDL Chol Calc (NIH): 58 mg/dL (ref 0–99)
Triglycerides: 98 mg/dL (ref 0–149)
VLDL Cholesterol Cal: 18 mg/dL (ref 5–40)

## 2020-08-08 LAB — HEPATIC FUNCTION PANEL
ALT: 11 IU/L (ref 0–44)
AST: 15 IU/L (ref 0–40)
Albumin: 4 g/dL (ref 3.8–4.8)
Alkaline Phosphatase: 86 IU/L (ref 44–121)
Bilirubin Total: 0.5 mg/dL (ref 0.0–1.2)
Bilirubin, Direct: 0.14 mg/dL (ref 0.00–0.40)
Total Protein: 6.3 g/dL (ref 6.0–8.5)

## 2020-08-08 LAB — IRON: Iron: 29 ug/dL — ABNORMAL LOW (ref 38–169)

## 2020-08-08 NOTE — Assessment & Plan Note (Signed)
History of hyperlipidemia on Praluent.  He has not had blood work for a year we will recheck a lipid liver profile.  His last cholesterol 05/31/2019 revealed total cholesterol 54, LDL 89 and HDL 40.

## 2020-08-08 NOTE — Assessment & Plan Note (Signed)
History of essential hypertension blood pressure measured today 136/84.  He is on metoprolol.

## 2020-08-08 NOTE — Progress Notes (Signed)
08/08/2020 Harold Garner   [birth year]  [id number]  Primary Physician Maurice Small, MD Primary Cardiologist: Lorretta Harp MD FACP, [place], [place], [place]  HPI:  Harold Garner is a 69 y.o.  moderately overweight married Caucasian male with no children who I last saw  05/31/2019. He has a history of CAD status post coronary artery bypass grafting in October of 2008 by Dr. Gilford Raid. He had a LIMA to his LAD, a vein to a 1st and 2nd marginal branch sequentially, and a vein to the PDA. His EF was 35% at that time with followup echo revealing normalization of his ejection fraction 12/08. He also has hyperlipidemia, intolerant to statin drugs. He denies chest pain or shortness of breath.Because of his statin intolerance he was begun on Repatha.He has had an excellent response to Repatha.  In February 2020 he was complaining of fatigue and shortness of breath.  He was ultimately found to be anemic and underwent transfusion.  He subsequently felt clinically improved.  Since I saw him a year ago he continues to do well.  He denies chest pain or shortness of breath.  His major complaint is difficulty sleeping.  Current Meds  Medication Sig  . Alirocumab (PRALUENT) 150 MG/ML SOAJ Inject 150 mg into the skin every 14 (fourteen) days.  Marland Kitchen aspirin 81 MG tablet Take 81 mg by mouth every morning.   Marland Kitchen esomeprazole (NEXIUM) 20 MG capsule Take 20 mg by mouth daily.   . ferrous sulfate 325 (65 FE) MG tablet Take 1 tablet (325 mg total) by mouth daily for 30 days.  . metoprolol succinate (TOPROL-XL) 25 MG 24 hr tablet TAKE ONE TABLET BY MOUTH DAILY  . Multiple Vitamin (MULTIVITAMIN) tablet Take 1 tablet by mouth daily.     Allergies  Allergen Reactions  . Darvon [Propoxyphene Hcl] Itching  . Statins Other (See Comments)    Has tried pravachol, crestor and lipitor    Social History   Socioeconomic History  . Marital status: Married    Spouse name: Not on file  . Number of children: Not  on file  . Years of education: Not on file  . Highest education level: Not on file  Occupational History  . Not on file  Tobacco Use  . Smoking status: Light Tobacco Smoker    Types: Cigars  . Smokeless tobacco: Never Used  . Tobacco comment: chew on them mostly  Vaping Use  . Vaping Use: Never used  Substance and Sexual Activity  . Alcohol use: Yes  . Drug use: No  . Sexual activity: Not on file  Other Topics Concern  . Not on file  Social History Narrative  . Not on file   Social Determinants of Health   Financial Resource Strain:   . Difficulty of Paying Living Expenses: Not on file  Food Insecurity:   . Worried About Charity fundraiser in the Last Year: Not on file  . Ran Out of Food in the Last Year: Not on file  Transportation Needs:   . Lack of Transportation (Medical): Not on file  . Lack of Transportation (Non-Medical): Not on file  Physical Activity:   . Days of Exercise per Week: Not on file  . Minutes of Exercise per Session: Not on file  Stress:   . Feeling of Stress : Not on file  Social Connections:   . Frequency of Communication with Friends and Family: Not on file  . Frequency of Social Gatherings  with Friends and Family: Not on file  . Attends Religious Services: Not on file  . Active Member of Clubs or Organizations: Not on file  . Attends Archivist Meetings: Not on file  . Marital Status: Not on file  Intimate Partner Violence:   . Fear of Current or Ex-Partner: Not on file  . Emotionally Abused: Not on file  . Physically Abused: Not on file  . Sexually Abused: Not on file     Review of Systems: General: negative for chills, fever, night sweats or weight changes.  Cardiovascular: negative for chest pain, dyspnea on exertion, edema, orthopnea, palpitations, paroxysmal nocturnal dyspnea or shortness of breath Dermatological: negative for rash Respiratory: negative for cough or wheezing Urologic: negative for hematuria Abdominal:  negative for nausea, vomiting, diarrhea, bright red blood per rectum, melena, or hematemesis Neurologic: negative for visual changes, syncope, or dizziness All other systems reviewed and are otherwise negative except as noted above.    Blood pressure 136/84, pulse (!) 56, height 6\' 3"  (1.905 m), weight 268 lb (121.6 kg).  General appearance: alert and no distress Neck: no adenopathy, no carotid bruit, no JVD, supple, symmetrical, trachea midline and thyroid not enlarged, symmetric, no tenderness/mass/nodules Lungs: clear to auscultation bilaterally Heart: regular rate and rhythm, S1, S2 normal, no murmur, click, rub or gallop Extremities: extremities normal, atraumatic, no cyanosis or edema Pulses: 2+ and symmetric Skin: Skin color, texture, turgor normal. No rashes or lesions Neurologic: Alert and oriented X 3, normal strength and tone. Normal symmetric reflexes. Normal coordination and gait  EKG sinus bradycardia 56 with sinus arrhythmia.  There was LVH voltage as well.  I personally reviewed this EKG.  ASSESSMENT AND PLAN:   Essential hypertension History of essential hypertension blood pressure measured today 136/84.  He is on metoprolol.  Hyperlipidemia History of hyperlipidemia on Praluent.  He has not had blood work for a year we will recheck a lipid liver profile.  His last cholesterol 05/31/2019 revealed total cholesterol 54, LDL 89 and HDL 40.  Coronary artery disease History of CAD status post coronary artery bypass grafting October 2008 by Dr. Aaron Edelman vital.  He had a LIMA to his LAD, vein to the first and second marginal branches sequentially and a vein to the PDA.  His EF at that time was 35% which ultimately improved by 2D echo.  He denies chest pain or shortness of breath.      Lorretta Harp MD FACP,FACC,FAHA, [facility] 08/08/2020 11:17 AM

## 2020-08-08 NOTE — Patient Instructions (Signed)
Medication Instructions:  Dr. Gwenlyn Found suggests that you can take melatonin 3mg  to help with sleep.  Your physician recommends that you continue on your current medications as directed. Please refer to the Current Medication list given to you today.  *If you need a refill on your cardiac medications before your next appointment, please call your pharmacy*   Lab Work: Your physician recommends that you have labs today: Lipid/liver profile, CBC and Iron.  If you have labs (blood work) drawn today and your tests are completely normal, you will receive your results only by: Marland Kitchen MyChart Message (if you have MyChart) OR . A paper copy in the mail If you have any lab test that is abnormal or we need to change your treatment, we will call you to review the results.  Follow-Up: At [facility], you and your health needs are our priority.  As part of our continuing mission to provide you with exceptional heart care, we have created designated Provider Care Teams.  These Care Teams include your primary Cardiologist (physician) and Advanced Practice Providers (APPs -  Physician Assistants and Nurse Practitioners) who all work together to provide you with the care you need, when you need it.  We recommend signing up for the patient portal called "MyChart".  Sign up information is provided on this After Visit Summary.  MyChart is used to connect with patients for Virtual Visits (Telemedicine).  Patients are able to view lab/test results, encounter notes, upcoming appointments, etc.  Non-urgent messages can be sent to your provider as well.   To learn more about what you can do with MyChart, go to [email or web address].    Your next appointment:   12 month(s)  The format for your next appointment:   In Person  Provider:   Quay Burow, MD

## 2020-08-08 NOTE — Assessment & Plan Note (Signed)
History of CAD status post coronary artery bypass grafting October 2008 by Dr. Aaron Edelman vital.  He had a LIMA to his LAD, vein to the first and second marginal branches sequentially and a vein to the PDA.  His EF at that time was 35% which ultimately improved by 2D echo.  He denies chest pain or shortness of breath.

## 2020-09-13 DIAGNOSIS — J0100 Acute maxillary sinusitis, unspecified: Secondary | ICD-10-CM | POA: Diagnosis not present

## 2020-10-11 ENCOUNTER — Other Ambulatory Visit: Payer: Self-pay

## 2020-10-11 MED ORDER — METOPROLOL SUCCINATE ER 25 MG PO TB24: ORAL_TABLET | ORAL | 3 refills | Status: DC

## 2020-11-05 DIAGNOSIS — I878 Other specified disorders of veins: Secondary | ICD-10-CM | POA: Diagnosis not present

## 2020-11-05 DIAGNOSIS — K449 Diaphragmatic hernia without obstruction or gangrene: Secondary | ICD-10-CM | POA: Diagnosis not present

## 2020-11-05 DIAGNOSIS — N202 Calculus of kidney with calculus of ureter: Secondary | ICD-10-CM | POA: Diagnosis not present

## 2020-11-05 DIAGNOSIS — N1330 Unspecified hydronephrosis: Secondary | ICD-10-CM | POA: Diagnosis not present

## 2020-11-05 DIAGNOSIS — N132 Hydronephrosis with renal and ureteral calculous obstruction: Secondary | ICD-10-CM | POA: Diagnosis not present

## 2020-11-06 ENCOUNTER — Other Ambulatory Visit: Payer: Self-pay | Admitting: Urology

## 2020-11-08 NOTE — Patient Instructions (Addendum)
DUE TO COVID-19 ONLY ONE VISITOR IS ALLOWED TO COME WITH YOU AND STAY IN THE WAITING ROOM ONLY DURING PRE OP AND PROCEDURE DAY OF SURGERY. THE 1 VISITOR  MAY VISIT WITH YOU AFTER SURGERY IN YOUR PRIVATE ROOM DURING VISITING HOURS ONLY!  YOU NEED TO HAVE A COVID 19 TEST ON_2/18______ @_12 :05______, THIS TEST MUST BE DONE BEFORE SURGERY,  COVID TESTING SITE [address] [place] [address], IT IS ON THE RIGHT GOING OUT [address] APPROXIMATELY  2 MINUTES PAST [employer] ON THE RIGHT. ONCE YOUR COVID TEST IS COMPLETED,  PLEASE BEGIN THE QUARANTINE INSTRUCTIONS AS OUTLINED IN YOUR HANDOUT.                [name]    Your procedure is scheduled on: 11/13/20   Report to [facility] Main  Entrance   Report to admitting at   9:00 AM     Call this number if you have problems the morning of surgery [phone]    Remember: Do not eat food or drink liquids :After Midnight  . BRUSH YOUR TEETH MORNING OF SURGERY AND RINSE YOUR MOUTH OUT, NO CHEWING GUM CANDY OR MINTS.     Take these medicines the morning of surgery with A SIP OF WATER:Metoprolol, Tamsulosin                                  You may not have any metal on your body including              piercings  Do not wear jewelry, lotions, powders or deodorant               Men may shave face and neck.   Do not bring valuables to the hospital. [place].  Contacts, dentures or bridgework may not be worn into surgery.      Patients discharged the day of surgery will not be allowed to drive home.   IF YOU ARE HAVING SURGERY AND GOING HOME THE SAME DAY, YOU MUST HAVE AN ADULT TO DRIVE YOU HOME AND BE WITH YOU FOR 24 HOURS.  YOU MAY GO HOME BY TAXI OR UBER OR ORTHERWISE, BUT AN ADULT MUST ACCOMPANY YOU HOME AND STAY WITH YOU FOR 24 HOURS.  Name and phone number of your driver:  Special Instructions: N/A              Please read over the following  fact sheets you were given: _____________________________________________________________________             [facility] - Preparing for Surgery Before surgery, you can play an important role.  Because skin is not sterile, your skin needs to be as free of germs as possible.  You can reduce the number of germs on your skin by washing with CHG (chlorahexidine gluconate) soap before surgery.  CHG is an antiseptic cleaner which kills germs and bonds with the skin to continue killing germs even after washing. Please DO NOT use if you have an allergy to CHG or antibacterial soaps.  If your skin becomes reddened/irritated stop using the CHG and inform your nurse when you arrive at Short Stay. .  You may shave your face/neck. Please follow these instructions carefully:  1.  Shower with CHG Soap the night before surgery  and the  morning of Surgery.  2.  If you choose to wash your hair, wash your hair first as usual with your  normal  shampoo.  3.  After you shampoo, rinse your hair and body thoroughly to remove the  shampoo.                                        4.  Use CHG as you would any other liquid soap.  You can apply chg directly  to the skin and wash                       Gently with a scrungie or clean washcloth.  5.  Apply the CHG Soap to your body ONLY FROM THE NECK DOWN.   Do not use on face/ open                           Wound or open sores. Avoid contact with eyes, ears mouth and genitals (private parts).                       Wash face,  Genitals (private parts) with your normal soap.             6.  Wash thoroughly, paying special attention to the area where your surgery  will be performed.  7.  Thoroughly rinse your body with warm water from the neck down.  8.  DO NOT shower/wash with your normal soap after using and rinsing off  the CHG Soap.             9.  Pat yourself dry with a clean towel.            10.  Wear clean pajamas.            11.  Place clean sheets on your bed the  night of your first shower and do not  sleep with pets. Day of Surgery : Do not apply any lotions/deodorants the morning of surgery.  Please wear clean clothes to the hospital/surgery center.  FAILURE TO FOLLOW THESE INSTRUCTIONS MAY RESULT IN THE CANCELLATION OF YOUR SURGERY PATIENT SIGNATURE_________________________________  NURSE SIGNATURE__________________________________  ________________________________________________________________________

## 2020-11-09 ENCOUNTER — Encounter (HOSPITAL_COMMUNITY): Payer: Self-pay

## 2020-11-09 ENCOUNTER — Other Ambulatory Visit (HOSPITAL_COMMUNITY)
Admission: RE | Admit: 2020-11-09 | Discharge: 2020-11-09 | Disposition: A | Discharge status: Home / Self Care | Payer: Medicare HMO | Source: Ambulatory Visit | Attending: Urology | Admitting: Urology

## 2020-11-09 ENCOUNTER — Encounter (HOSPITAL_COMMUNITY)
Admission: RE | Admit: 2020-11-09 | Discharge: 2020-11-09 | Disposition: A | Discharge status: Home / Self Care | Payer: Medicare HMO | Source: Ambulatory Visit | Attending: Urology | Admitting: Urology

## 2020-11-09 ENCOUNTER — Other Ambulatory Visit: Payer: Self-pay

## 2020-11-09 DIAGNOSIS — Z20822 Contact with and (suspected) exposure to covid-19: Secondary | ICD-10-CM | POA: Diagnosis not present

## 2020-11-09 DIAGNOSIS — Z01812 Encounter for preprocedural laboratory examination: Secondary | ICD-10-CM | POA: Diagnosis not present

## 2020-11-09 HISTORY — DX: Anemia, unspecified: D64.9

## 2020-11-09 HISTORY — DX: Unspecified osteoarthritis, unspecified site: M19.90

## 2020-11-09 LAB — BASIC METABOLIC PANEL
Anion gap: 10 (ref 5–15)
BUN: 17 mg/dL (ref 8–23)
CO2: 25 mmol/L (ref 22–32)
Calcium: 9 mg/dL (ref 8.9–10.3)
Chloride: 106 mmol/L (ref 98–111)
Creatinine, Ser: 1.23 mg/dL (ref 0.61–1.24)
GFR, Estimated: >60 mL/min (ref >60)
Glucose, Bld: 113 mg/dL — ABNORMAL HIGH (ref 70–99)
Potassium: 4 mmol/L (ref 3.5–5.1)
Sodium: 141 mmol/L (ref 135–145)

## 2020-11-09 LAB — CBC
HCT: 37.9 % — ABNORMAL LOW (ref 39.0–52.0)
Hemoglobin: 10.8 g/dL — ABNORMAL LOW (ref 13.0–17.0)
MCH: 22.7 pg — ABNORMAL LOW (ref 26.0–34.0)
MCHC: 28.5 g/dL — ABNORMAL LOW (ref 30.0–36.0)
MCV: 79.6 fL — ABNORMAL LOW (ref 80.0–100.0)
Platelets: 278 10*3/uL (ref 150–400)
RBC: 4.76 MIL/uL (ref 4.22–5.81)
RDW: 18.4 % — ABNORMAL HIGH (ref 11.5–15.5)
WBC: 4.6 10*3/uL (ref 4.0–10.5)
nRBC: 0 % (ref 0.0–0.2)

## 2020-11-09 LAB — SARS CORONAVIRUS 2 (TAT 6-24 HRS): SARS Coronavirus 2: NEGATIVE

## 2020-11-09 NOTE — Progress Notes (Signed)
COVID Vaccine Completed:Yes Date COVID Vaccine completed:11/29/19 COVID vaccine manufacturer: [employer]     PCP - Dr. Beckie Salts Cardiologist -Dr. Adora Fridge  Chest x-ray - no EKG - 08/08/20-epic Stress Test - 06/25/11-epic ECHO - 08/27/07-epic Cardiac Cath - CABG x3 2008 Pacemaker/ICD device last checked:NA  Sleep Study - no CPAP -   Fasting Blood Sugar -NA  Checks Blood Sugar _____ times a day  Blood Thinner Instructions:ASA81/ Dr. Justin Mend Aspirin Instructions:none Last Dose:  Anesthesia review:   Patient denies shortness of breath, fever, cough and chest pain at PAT appointment yes  Patient verbalized understanding of instructions that were given to them at the PAT appointment. Patient was also instructed that they will need to review over the PAT instructions again at home before surgery.yes Pt reports no SOB with any activities.

## 2020-11-12 NOTE — Anesthesia Preprocedure Evaluation (Addendum)
Anesthesia Evaluation  Patient identified by MRN, date of birth, ID band Patient awake    Reviewed: Allergy & Precautions, NPO status , Patient's Chart, lab work & pertinent test results, reviewed documented beta blocker date and time   Airway Mallampati: II  TM Distance: >3 FB Neck ROM: Full    Dental  (+) Missing,    Pulmonary Current Smoker and Patient abstained from smoking.,    Pulmonary exam normal breath sounds clear to auscultation       Cardiovascular hypertension, Pt. on medications and Pt. on home beta blockers + CAD and + Past MI  Normal cardiovascular exam Rhythm:Regular Rate:Normal  MI 2008  EKG 08/08/20 Sinus Bradycardia with marked sinus arrhythmia   Neuro/Psych  Neuromuscular disease negative psych ROS   GI/Hepatic Neg liver ROS, GERD  Medicated and Controlled,  Endo/Other  Hyperlipidemia Obesity  Renal/GU Right ureteral calculus  negative genitourinary   Musculoskeletal  (+) Arthritis , Osteoarthritis,    Abdominal (+) + obese,   Peds  Hematology  (+) anemia ,   Anesthesia Other Findings   Reproductive/Obstetrics                           Anesthesia Physical Anesthesia Plan  ASA: III  Anesthesia Plan: General   Post-op Pain Management:    Induction: Intravenous  PONV Risk Score and Plan: 2 and Ondansetron, Treatment may vary due to age or medical condition and Dexamethasone  Airway Management Planned: LMA  Additional Equipment:   Intra-op Plan:   Post-operative Plan: Extubation in OR  Informed Consent: I have reviewed the patients History and Physical, chart, labs and discussed the procedure including the risks, benefits and alternatives for the proposed anesthesia with the patient or authorized representative who has indicated his/her understanding and acceptance.     Dental advisory given  Plan Discussed with: CRNA and Anesthesiologist  Anesthesia  Plan Comments: (See APP note by Durel Salts, FNP)       Anesthesia Quick Evaluation

## 2020-11-12 NOTE — H&P (Signed)
CC/HPI: cc: right flank pain   09/08/19: 70 year old man with a history of kidney stones last seen on 08/15/2019 with right flank pain found have a 6 mm right proximal ureteral calculus and bilateral nonobstructing renal calculi. Patient said the pain resolved a day or 2 after his last visit. He does not think he has passed anything. He has required ESWL the past. He denies any fever, chills nausea or vomiting. He denies any gross hematuria. UA today without any hematuria.   08/15/19: 70 year old man with history of kidney stones previously treated by Dr. Karsten Ro who comes in with right flank pain that began yesterday and emesis x1. He is currently not in any pain. He has passed stones spontaneously in the past as well as had ESWL. He thinks his last stone episode was 2014. He has a history of MI and cardiac stents and is currently on aspirin. Today he is not in any pain and he denies hematuria, fevers or chills. His urinalysis is remarkable for RBCs only.   11/05/20: 70 year old man with history of kidney stones comes in with 3 day history of right back pain that feels like a kidney stone to him. He denies any fevers, chills, nausea or vomiting. He denies any hematuria. He had some Toradol on hand at home which he took and helped his pain.     ALLERGIES: Darvon CAPS    MEDICATIONS: Ketorolac Tromethamine 10 mg tablet 1 tablet PO Q 6 H PRN Take as needed but not for longer than 5 days, do not take with aspirin  Aspirin Ec 81 mg tablet, delayed release Oral  Cialis 5 mg tablet 0 Oral Daily  Co Q-10 10 mg capsule Oral  Metoprolol Tartrate 25 mg tablet Oral     GU PSH: ESWL - 2015       PSH Notes: Lithotripsy, Heart Surgery   NON-GU PSH: None   GU PMH: Renal and ureteral calculus (Stable, Acute), Right, Patient would like to try passed the stones as he is currently not on any pain and has passed stones in the past. I recommended he go to the emergency room if he develops high fevers/chills or  intractable nausea and vomiting. I gave him a strainer, Flomax and pain medication as well as Zofran and he will have a 2 week trial medical expulsive therapy. Will obtain a KUB in 2 weeks for ESWL planning if he has not passed the stones. 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, unspecified, Erectile dysfunction - 2015 Renal calculus, Renal calculus, left - 2015 Adrenal mass Unspec, Adrenal cortical adenoma, unspecified laterality - 2015 BPH w/o LUTS, Benign prostatic hypertrophy without lower urinary tract symptoms - 2014 History of urolithiasis, Nephrolithiasis - 2014 Hydronephrosis Unspec, Hydronephrosis On The Right - 2014 Ureteral calculus, Mid Ureteral Stone On The Right - 2014, Distal Ureteral Stone On The Right, - 2014      PMH Notes:  2009-01-11 11:18:07 - Note: Acute Myocardial Infarction   NON-GU PMH: Encounter for general adult medical examination without abnormal findings, Encounter for preventive health examination - 2015 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, Liver neoplasm - 2015 Decreased libido, Decreased libido - 2014 Personal history of other diseases of the circulatory system, History of hypertension - 2014 Personal history of other diseases of the nervous system and sense organs, History of glaucoma - 2014 Personal history of other endocrine, nutritional and metabolic disease, History of hypercholesterolemia - 2014 Personal history of other specified conditions, History of heartburn - 2014    FAMILY  HISTORY: Acute Myocardial Infarction - Mother Family Health Status - Father alive at age 44 - Father Family Health Status - Mother's Age - Father Lung Cancer - Father   SOCIAL HISTORY: Marital Status: Married Preferred Language: English; Ethnicity: Not Hispanic Or Latino; Race: White Current Smoking Status: Patient smokes occasionally.   Tobacco Use Assessment Completed: Used Tobacco in last 30 days? Social Drinker.  Drinks 3 caffeinated drinks per day.     Notes:  Current some day smoker, Caffeine Use, Marital History - Currently Married, Tobacco Use, Alcohol Use, Occupation:   REVIEW OF SYSTEMS:    GU Review Male:   Patient reports get up at night to urinate. Patient denies frequent urination, hard to postpone urination, burning/ pain with urination, leakage of urine, stream starts and stops, trouble starting your stream, have to strain to urinate , erection problems, and penile pain.  Gastrointestinal (Upper):   Patient denies nausea, vomiting, and indigestion/ heartburn.  Gastrointestinal (Lower):   Patient denies diarrhea and constipation.  Constitutional:   Patient reports fatigue. Patient denies fever, night sweats, and weight loss.  Skin:   Patient denies skin rash/ lesion and itching.  Eyes:   Patient denies blurred vision and double vision.  Ears/ Nose/ Throat:   Patient denies sore throat and sinus problems.  Hematologic/Lymphatic:   Patient denies swollen glands and easy bruising.  Cardiovascular:   Patient denies leg swelling and chest pains.  Respiratory:   Patient denies cough and shortness of breath.  Endocrine:   Patient denies excessive thirst.  Musculoskeletal:   Patient reports back pain. Patient denies joint pain.  Neurological:   Patient denies headaches and dizziness.  Psychologic:   Patient denies depression and anxiety.   VITAL SIGNS:      11/05/2020 10:12 AM  Weight 250 lb / 113.4 kg  Height 75 in / 190.5 cm  BP 151/88 mmHg  Pulse 65 /min  Temperature 97.5 F / 36.3 C  BMI 31.2 kg/m   GU PHYSICAL EXAMINATION:      Notes: No CVA tenderness bilaterally   MULTI-SYSTEM PHYSICAL EXAMINATION:    Constitutional: Well-nourished. No physical deformities. Normally developed. Good grooming.  Neck: Neck symmetrical, not swollen. Normal tracheal position.  Respiratory: No labored breathing, no use of accessory muscles.   Skin: No paleness, no jaundice, no cyanosis. No lesion, no ulcer, no rash.  Neurologic / Psychiatric:  Oriented to time, oriented to place, oriented to person. No depression, no anxiety, no agitation.  Gastrointestinal: No tenderness, no rigidity  Eyes: Normal conjunctivae. Normal eyelids.  Ears, Nose, Mouth, and Throat: Left ear no scars, no lesions, no masses. Right ear no scars, no lesions, no masses. Nose no scars, no lesions, no masses. Normal hearing. Normal lips.  Musculoskeletal: Normal gait and station of head and neck.     Complexity of Data:  X-Ray Review: C.T. Abdomen/Pelvis: Reviewed Films. Discussed With Patient.     03/13/14 03/16/13 03/07/10 03/08/09 01/22/04  PSA  Total PSA 1.05  0.81  0.74  0.71  0.61     03/07/10 02/22/04  Hormones  Testosterone, Total 208.0  1.58    Notes:                     12/05/2018: BUN 7  09/09/2019: Creatinine 0.7   12/03/2018: PSA 1.0   PROCEDURES:         C.T. ABD-Pelv w/o - [id number]      Patient confirmed No Neulasta OnPro Device.  Urinalysis w/Scope Dipstick Dipstick Cont'd Micro  Color: Amber Bilirubin: Neg mg/dL WBC/hpf: 0 - 5/hpf  Appearance: Clear Ketones: Neg mg/dL RBC/hpf: 0 - 2/hpf  Specific Gravity: 1.025 Blood: Trace ery/uL Bacteria: Rare (0-9/hpf)  pH: 6.0 Protein: 1+ mg/dL Cystals: Ca Oxalate  Glucose: Neg mg/dL Urobilinogen: 0.2 mg/dL Casts: NS (Not Seen)    Nitrites: Neg Trichomonas: Not Present    Leukocyte Esterase: Neg leu/uL Mucous: Present      Epithelial Cells: NS (Not Seen)      Yeast: NS (Not Seen)      Sperm: Not Present    ASSESSMENT:      ICD-10 Details  1 GU:   Ureteral calculus - Z66.2 Acute, Uncomplicated  2   Renal calculus - N20.0 Chronic, Stable   PLAN:            Medications New Meds: Ketorolac Tromethamine 10 mg tablet 1 tablet PO Q 8 H PRN do not take with aspirin  #10  0 Refill(s)  Tamsulosin Hcl 0.4 mg capsule 1 capsule PO Q HS   #30  1 Refill(s)    Stop Meds: Ketorolac Tromethamine 10 mg tablet 1 tablet PO Q 8 H PRN  Start: 11/05/2020  Stop: 11/08/2020   Discontinue:  11/05/2020  - Reason: The medication was entered incorrectly.            Orders X-Rays: C.T. Abdomen/Pelvis Without Contrast  X-Ray Notes: History:  Hematuria: Yes/No  Patient to see MD after exam: Yes/No  Previous exam: CT / IVP/ US/ KUB/ None  When:  Where:  Diabetic: Yes/ No  BUN/ Creatine:  Date of last BUN Creatinine:  Weight in pounds:  Allergy- Contrasts/ Shellfish: Yes/ No  Conflicting diabetic meds: Yes/ No  Oral contrast and instructions given to patient:   Prior Authorization #: [id number] valid 11/05/20 thru 05/04/21            Schedule         Document Letter(s):  Created for Patient: Clinical Summary         Notes:   CT the abdomen pelvis shows right mid ureteral calculi causing moderate to severe hydronephrosis. Patient's pain is well controlled and urinalysis without signs of infection. He would like to try passed the stones for now however will tentatively schedule him for ureteroscopy with laser lithotripsy and stent placement next week given the stranding and severity of hydronephrosis. Risks and benefits of the procedure were discussed with the patient including but not limited to pain, infection, damage to surrounding structures, need for future procedure, inability removed stone, stent discomfort.   cc: Maurice Small, MD

## 2020-11-12 NOTE — Progress Notes (Signed)
Anesthesia Chart Review:   Case: [id number] Date/Time: 11/13/20 1045   Procedure: CYSTOSCOPY/URETEROSCOPY/HOLMIUM LASER/STENT PLACEMENT (Right ) - 90 MINS   Anesthesia type: General   Pre-op diagnosis: RIGHT URETERAL CALCULUS   Location: [facility] ROOM 03 / [facility]   Surgeons: Robley Fries, MD      DISCUSSION:  Pt is a 70 year old with hx CAD (s/p CABG 2008), iron deficiency anemia. Current smoker.   Pt denied active CV symptoms at pre-surgical testing.    VS: BP (!) 147/83   Pulse 61   Temp 36.6 C (Oral)   Resp 20   Ht 6\' 3"  (1.905 m)   Wt 113.4 kg   SpO2 99%   BMI 31.25 kg/m   PROVIDERS: - PCP is Maurice Small, MD - Cardiologist is Quay Burow, MD. Last office visit 08/08/20.     LABS: Labs reviewed: Acceptable for surgery. (all labs ordered are listed, but only abnormal results are displayed)  Labs Reviewed  BASIC METABOLIC PANEL - Abnormal; Notable for the following components:      Result Value   Glucose, Bld 113 (*)    All other components within normal limits  CBC - Abnormal; Notable for the following components:   Hemoglobin 10.8 (*)    HCT 37.9 (*)    MCV 79.6 (*)    MCH 22.7 (*)    MCHC 28.5 (*)    RDW 18.4 (*)    All other components within normal limits     EKG 08/08/20: sinus bradycardia with marked sinus arrhythmia   CV: No recent testing. Last stress test 2012 was low risk. Last echo was 2008 and showed EF recovered to 53% (from 35% after CABG).     Past Medical History:  Diagnosis Date  . Anemia    iron deficeiency  . Arthritis   . Coronary artery disease    status post coronary artery bypass grafting October 2008 by Dr. Arvid Right   . GERD (gastroesophageal reflux disease)   . History of heart attack   . Hyperlipidemia    intolerant to statin drugs  . Hypertension   . Myocardial infarction [facility]) 2008    Past Surgical History:  Procedure Laterality Date  . CARDIAC CATHETERIZATION  06/25/2007   LAD 90%, RCA 80%, 3 vessel  disease, moderate LV dysfunction.  . CORONARY ARTERY BYPASS GRAFT  06/2007   Dr Cyndia Bent  . HAND SURGERY Left   . NM MYOCAR PERF WALL MOTION  06/25/2011   protocol:Lexiscan, post stress EF 59%, low risk scan  . TONSILLECTOMY     childhood  . TRANSTHORACIC ECHOCARDIOGRAM  08/27/2007   normal Echo    MEDICATIONS: . Alirocumab (PRALUENT) 150 MG/ML SOAJ  . aspirin 81 MG tablet  . esomeprazole (NEXIUM) 20 MG capsule  . ferrous sulfate 325 (65 FE) MG tablet  . fluticasone (FLONASE) 50 MCG/ACT nasal spray  . ibuprofen (ADVIL) 200 MG tablet  . ketorolac (TORADOL) 10 MG tablet  . metoprolol succinate (TOPROL-XL) 25 MG 24 hr tablet  . Multiple Vitamin (MULTIVITAMIN) tablet  . naproxen sodium (ALEVE) 220 MG tablet  . tamsulosin (FLOMAX) 0.4 MG CAPS capsule   No current facility-administered medications for this encounter.    If no changes, I anticipate pt can proceed with surgery as scheduled.   Willeen Cass, PhD, FNP-BC [facility]/Anesthesiology Phone: [phone] 11/12/2020 8:48 AM

## 2020-11-13 ENCOUNTER — Encounter (HOSPITAL_COMMUNITY)
Admission: RE | Disposition: A | Discharge status: Home / Self Care | Payer: Self-pay | Source: Home / Self Care | Attending: Urology

## 2020-11-13 ENCOUNTER — Other Ambulatory Visit: Payer: Self-pay

## 2020-11-13 ENCOUNTER — Ambulatory Visit (HOSPITAL_COMMUNITY): Payer: Medicare HMO

## 2020-11-13 ENCOUNTER — Ambulatory Visit (HOSPITAL_COMMUNITY): Payer: Medicare HMO | Admitting: Certified Registered Nurse Anesthetist

## 2020-11-13 ENCOUNTER — Ambulatory Visit (HOSPITAL_COMMUNITY): Payer: Medicare HMO | Admitting: Emergency Medicine

## 2020-11-13 ENCOUNTER — Ambulatory Visit (HOSPITAL_COMMUNITY)
Admission: RE | Admit: 2020-11-13 | Discharge: 2020-11-13 | Disposition: A | Discharge status: Home / Self Care | Payer: Medicare HMO | Attending: Urology | Admitting: Urology

## 2020-11-13 ENCOUNTER — Encounter (HOSPITAL_COMMUNITY): Payer: Self-pay | Admitting: Urology

## 2020-11-13 DIAGNOSIS — D509 Iron deficiency anemia, unspecified: Secondary | ICD-10-CM | POA: Diagnosis not present

## 2020-11-13 DIAGNOSIS — Z7982 Long term (current) use of aspirin: Secondary | ICD-10-CM | POA: Insufficient documentation

## 2020-11-13 DIAGNOSIS — R69 Illness, unspecified: Secondary | ICD-10-CM | POA: Diagnosis not present

## 2020-11-13 DIAGNOSIS — Z87442 Personal history of urinary calculi: Secondary | ICD-10-CM | POA: Insufficient documentation

## 2020-11-13 DIAGNOSIS — I2510 Atherosclerotic heart disease of native coronary artery without angina pectoris: Secondary | ICD-10-CM | POA: Diagnosis not present

## 2020-11-13 DIAGNOSIS — Z885 Allergy status to narcotic agent status: Secondary | ICD-10-CM | POA: Insufficient documentation

## 2020-11-13 DIAGNOSIS — N132 Hydronephrosis with renal and ureteral calculous obstruction: Secondary | ICD-10-CM | POA: Diagnosis not present

## 2020-11-13 DIAGNOSIS — F17200 Nicotine dependence, unspecified, uncomplicated: Secondary | ICD-10-CM | POA: Diagnosis not present

## 2020-11-13 DIAGNOSIS — I10 Essential (primary) hypertension: Secondary | ICD-10-CM | POA: Diagnosis not present

## 2020-11-13 DIAGNOSIS — Z79899 Other long term (current) drug therapy: Secondary | ICD-10-CM | POA: Insufficient documentation

## 2020-11-13 DIAGNOSIS — N201 Calculus of ureter: Secondary | ICD-10-CM | POA: Diagnosis not present

## 2020-11-13 HISTORY — PX: CYSTOSCOPY/URETEROSCOPY/HOLMIUM LASER/STENT PLACEMENT: SHX6546

## 2020-11-13 SURGERY — CYSTOSCOPY/URETEROSCOPY/HOLMIUM LASER/STENT PLACEMENT
Anesthesia: General | Laterality: Right

## 2020-11-13 MED ORDER — PROPOFOL 10 MG/ML IV BOLUS
INTRAVENOUS | Status: DC | PRN
  Administered 2020-11-13: 30 mg via INTRAVENOUS
  Administered 2020-11-13: 150 mg via INTRAVENOUS

## 2020-11-13 MED ORDER — FENTANYL CITRATE (PF) 100 MCG/2ML IJ SOLN
INTRAMUSCULAR | Status: DC | PRN
  Administered 2020-11-13: 25 ug via INTRAVENOUS

## 2020-11-13 MED ORDER — ONDANSETRON HCL 4 MG/2ML IJ SOLN: 4.0000 mg | Freq: Once | INTRAMUSCULAR | Status: DC | PRN

## 2020-11-13 MED ORDER — FENTANYL CITRATE (PF) 100 MCG/2ML IJ SOLN: 25.0000 ug | INTRAMUSCULAR | Status: DC | PRN

## 2020-11-13 MED ORDER — CEFAZOLIN SODIUM-DEXTROSE 2-4 GM/100ML-% IV SOLN
2.0000 g | INTRAVENOUS | Status: AC
  Administered 2020-11-13: 2 g via INTRAVENOUS
  Filled 2020-11-13: qty 100

## 2020-11-13 MED ORDER — CIPROFLOXACIN HCL 500 MG PO TABS: 500.0000 mg | ORAL_TABLET | Freq: Once | ORAL | 0 refills | Status: AC

## 2020-11-13 MED ORDER — FENTANYL CITRATE (PF) 100 MCG/2ML IJ SOLN
INTRAMUSCULAR | Status: AC
  Filled 2020-11-13: qty 2

## 2020-11-13 MED ORDER — PHENYLEPHRINE HCL-NACL 10-0.9 MG/250ML-% IV SOLN
INTRAVENOUS | Status: DC | PRN
  Administered 2020-11-13: 30 ug/min via INTRAVENOUS

## 2020-11-13 MED ORDER — ONDANSETRON HCL 4 MG/2ML IJ SOLN
INTRAMUSCULAR | Status: DC | PRN
  Administered 2020-11-13: 4 mg via INTRAVENOUS

## 2020-11-13 MED ORDER — SODIUM CHLORIDE 0.9 % IR SOLN
Status: DC | PRN
  Administered 2020-11-13: 3000 mL

## 2020-11-13 MED ORDER — LACTATED RINGERS IV SOLN: INTRAVENOUS | Status: DC

## 2020-11-13 MED ORDER — CHLORHEXIDINE GLUCONATE 0.12 % MT SOLN
15.0000 mL | Freq: Once | OROMUCOSAL | Status: AC
  Administered 2020-11-13: 15 mL via OROMUCOSAL

## 2020-11-13 MED ORDER — ORAL CARE MOUTH RINSE: 15.0000 mL | Freq: Once | OROMUCOSAL | Status: AC

## 2020-11-13 MED ORDER — OXYCODONE HCL 5 MG PO TABS: 5.0000 mg | ORAL_TABLET | Freq: Once | ORAL | Status: DC | PRN

## 2020-11-13 MED ORDER — LIDOCAINE 2% (20 MG/ML) 5 ML SYRINGE
INTRAMUSCULAR | Status: DC | PRN
  Administered 2020-11-13: 60 mg via INTRAVENOUS

## 2020-11-13 MED ORDER — OXYCODONE HCL 5 MG/5ML PO SOLN: 5.0000 mg | Freq: Once | ORAL | Status: DC | PRN

## 2020-11-13 MED ORDER — EPHEDRINE 5 MG/ML INJ
INTRAVENOUS | Status: AC
  Filled 2020-11-13: qty 10

## 2020-11-13 MED ORDER — DEXAMETHASONE SODIUM PHOSPHATE 10 MG/ML IJ SOLN
INTRAMUSCULAR | Status: DC | PRN
  Administered 2020-11-13: 4 mg via INTRAVENOUS

## 2020-11-13 MED ORDER — FENTANYL CITRATE (PF) 100 MCG/2ML IJ SOLN
25.0000 ug | INTRAMUSCULAR | Status: DC | PRN
Start: 2020-11-13 — End: 2020-11-13

## 2020-11-13 MED ORDER — IOHEXOL 300 MG/ML  SOLN
INTRAMUSCULAR | Status: DC | PRN
  Administered 2020-11-13: 16 mL

## 2020-11-13 SURGICAL SUPPLY — 19 items
BAG URO CATCHER STRL LF (MISCELLANEOUS) ×2 IMPLANT
BASKET ZERO TIP NITINOL 2.4FR (BASKET) ×2 IMPLANT
CATH URET 5FR 28IN OPEN ENDED (CATHETERS) ×2 IMPLANT
CLOTH BEACON ORANGE TIMEOUT ST (SAFETY) ×2 IMPLANT
DRSG TEGADERM 2-3/8X2-3/4 SM (GAUZE/BANDAGES/DRESSINGS) IMPLANT
EXTRACTOR STONE 1.7FRX115CM (UROLOGICAL SUPPLIES) IMPLANT
GLOVE SURG ENC MOIS LTX SZ6.5 (GLOVE) ×2 IMPLANT
GOWN STRL REUS W/TWL LRG LVL3 (GOWN DISPOSABLE) ×2 IMPLANT
GUIDEWIRE STR DUAL SENSOR (WIRE) ×2 IMPLANT
KIT TURNOVER KIT A (KITS) ×2 IMPLANT
MANIFOLD NEPTUNE II (INSTRUMENTS) ×2 IMPLANT
PACK CYSTO (CUSTOM PROCEDURE TRAY) ×2 IMPLANT
SHEATH URETERAL 12FRX28CM (UROLOGICAL SUPPLIES) IMPLANT
SHEATH URETERAL 12FRX35CM (MISCELLANEOUS) IMPLANT
STENT URET 6FRX28 CONTOUR (STENTS) ×2 IMPLANT
TRACTIP FLEXIVA PULS ID 200XHI (Laser) ×1 IMPLANT
TRACTIP FLEXIVA PULSE ID 200 (Laser) ×1
TUBING CONNECTING 10 (TUBING) ×2 IMPLANT
TUBING UROLOGY SET (TUBING) ×2 IMPLANT

## 2020-11-13 NOTE — Transfer of Care (Signed)
Immediate Anesthesia Transfer of Care Note  Patient: Harold Garner  Procedure(s) Performed: CYSTOSCOPY/URETEROSCOPY/HOLMIUM LASER/STENT PLACEMENT (Right )  Patient Location: PACU  Anesthesia Type:General  Level of Consciousness: awake, alert , oriented and patient cooperative  Airway & Oxygen Therapy: Patient Spontanous Breathing and Patient connected to face mask oxygen  Post-op Assessment: Report given to RN, Post -op Vital signs reviewed and stable and Patient moving all extremities  Post vital signs: Reviewed and stable  Last Vitals:  Vitals Value Taken Time  BP 97/85 11/13/20 1220  Temp    Pulse 69 11/13/20 1234  Resp 17 11/13/20 1234  SpO2 96 % 11/13/20 1234  Vitals shown include unvalidated device data.  Last Pain:  Vitals:   11/13/20 0944  TempSrc: Oral         Complications: No complications documented.

## 2020-11-13 NOTE — Op Note (Signed)
Preoperative diagnosis: right ureteral calculus  Postoperative diagnosis: right ureteral calculus  Procedure:  1. Cystoscopy 2. right ureteroscopy with laser lithotripsy and basket stone extraction 3. right 51F x 28cm ureteral stent placement with tether 4. right retrograde pyelography with interpretation  Surgeon: Jacalyn Lefevre, MD  Anesthesia: General  Complications: None  Intraoperative findings: 1.  Normal anterior urethra 2.  Lateral lobe hypertrophy and prostatic urethra 3.  Bilateral orthotopic ureteral orifices 4.  No masses or abnormal bladder mucosa 5.  Right retrograde pyelogram with hydronephrosis proximal to distal ureteral calculus 6.  6 French by 28 cm JJ right ureteral stent with tether  EBL: Minimal  Specimens: 1. right ureteral calculus  Disposition of specimens: [facility] for stone analysis  Indication: Harold Garner is a 70 y.o.   patient with a 10mm right ureteral stone and associated right symptoms. After reviewing the management options for treatment, the patient elected to proceed with the above surgical procedure(s). We have discussed the potential benefits and risks of the procedure, side effects of the proposed treatment, the likelihood of the patient achieving the goals of the procedure, and any potential problems that might occur during the procedure or recuperation. Informed consent has been obtained.   Description of procedure:  The patient was taken to the operating room and general anesthesia was induced.  The patient was placed in the dorsal lithotomy position, prepped and draped in the usual sterile fashion, and preoperative antibiotics were administered. A preoperative time-out was performed.   Cystourethroscopy was performed.  The patient's urethra was examined and was normal demonstrated bilobar prostatic hypertrophy.The bladder was then systematically examined in its entirety. There was no evidence for any bladder  tumors, stones, or other mucosal pathology.    Attention then turned to the right ureteral orifice and a ureteral catheter was used to intubate the ureteral orifice.  Omnipaque contrast was injected through the ureteral catheter and a retrograde pyelogram was performed with findings as dictated above.  A 0.38 sensor guidewire was then advanced up the right ureter into the renal pelvis under fluoroscopic guidance. The 4.5 Fr semirigid ureteroscope was then advanced into the ureter next to the guidewire and the calculus was identified.   The stone was then fragmented with the 200 micron holmium laser fiber.  All stones were then removed from the ureter with a zero tip basket.  Reinspection of the ureter revealed no remaining visible stones or fragments.   The wire was then backloaded through the cystoscope and a ureteral stent was advance over the wire using Seldinger technique.  The stent was positioned appropriately under fluoroscopic and cystoscopic guidance.  The wire was then removed with an adequate stent curl noted in the renal pelvis as well as in the bladder.  The bladder was then emptied and the procedure ended.  The patient appeared to tolerate the procedure well and without complications.  The patient was able to be awakened and transferred to the recovery unit in satisfactory condition.   Disposition: The tether of the stent was left on and secured to the ventral aspect of the patient's penis.  Instructions for removing the stent have been provided to the patient.

## 2020-11-13 NOTE — Interval H&P Note (Signed)
History and Physical Interval Note:  11/13/2020 10:35 AM  [name]  has presented today for surgery, with the diagnosis of RIGHT URETERAL CALCULUS.  The various methods of treatment have been discussed with the patient and family. After consideration of risks, benefits and other options for treatment, the patient has consented to  Procedure(s) with comments: CYSTOSCOPY/URETEROSCOPY/HOLMIUM LASER/STENT PLACEMENT (Right) - 90 MINS as a surgical intervention.  The patient's history has been reviewed, patient examined, no change in status, stable for surgery.  I have reviewed the patient's chart and labs.  Questions were answered to the patient's satisfaction.     Melodi Happel D Marisa Hage

## 2020-11-13 NOTE — Anesthesia Postprocedure Evaluation (Signed)
Anesthesia Post Note  Patient: Harold Garner  Procedure(s) Performed: CYSTOSCOPY/URETEROSCOPY/HOLMIUM LASER/STENT PLACEMENT (Right )     Patient location during evaluation: PACU Anesthesia Type: General Level of consciousness: awake and alert and oriented Pain management: pain level controlled Vital Signs Assessment: post-procedure vital signs reviewed and stable Respiratory status: spontaneous breathing, nonlabored ventilation and respiratory function stable Cardiovascular status: blood pressure returned to baseline and stable Postop Assessment: no apparent nausea or vomiting Anesthetic complications: no   No complications documented.  Last Vitals:  Vitals:   11/13/20 1230 11/13/20 1245  BP: (!) 151/77 137/69  Pulse: 70 72  Resp: 20 (!) 24  Temp:    SpO2: 100% 94%    Last Pain:  Vitals:   11/13/20 1245  TempSrc:   PainSc: 0-No pain                 Hildagarde Holleran A.

## 2020-11-13 NOTE — Anesthesia Procedure Notes (Signed)
Procedure Name: LMA Insertion Date/Time: 11/13/2020 11:34 AM Performed by: Mitzie Na, CRNA Pre-anesthesia Checklist: Patient identified, Emergency Drugs available, Suction available and Patient being monitored Patient Re-evaluated:Patient Re-evaluated prior to induction Oxygen Delivery Method: Circle system utilized Preoxygenation: Pre-oxygenation with 100% oxygen Induction Type: IV induction LMA: LMA inserted LMA Size: 5.0 Number of attempts: 1 Placement Confirmation: positive ETCO2 and breath sounds checked- equal and bilateral Tube secured with: Tape Dental Injury: Teeth and Oropharynx as per pre-operative assessment

## 2020-11-13 NOTE — Discharge Instructions (Signed)
DISCHARGE INSTRUCTIONS FOR KIDNEY STONE/URETERAL STENT   MEDICATIONS:  1. Resume all your other meds from home .  2. Take cipro 1 tab 1 hour prior to stent removal on Friday  ACTIVITY:  1. No strenuous activity x 1week  2. No driving while on narcotic pain medications  3. Drink plenty of water  4. Continue to walk at home - you can still get blood clots when you are at home, so keep active, but don't over do it.  5. May return to work/school tomorrow or when you feel ready   BATHING:  1. You can shower and we recommend daily showers  2. You have a string coming from your urethra: The stent string is attached to your ureteral stent. Do not pull on this.   SIGNS/SYMPTOMS TO CALL:  Please call us if you have a fever greater than 101.5, uncontrolled nausea/vomiting, uncontrolled pain, dizziness, unable to urinate, bloody urine, chest pain, shortness of breath, leg swelling, leg pain, redness around wound, drainage from wound, or any other concerns or questions.   You can reach [place] at [phone].   FOLLOW-UP:  1. You have a string attached to your stent, you may remove it on Friday, February 25. To do this, pull the strings until the stent is completely removed. You may feel an odd sensation in your back. 2.  Please take the antibiotic 1 hour prior to removing the stent.

## 2020-11-14 ENCOUNTER — Encounter (HOSPITAL_COMMUNITY): Payer: Self-pay | Admitting: Urology

## 2020-11-27 DIAGNOSIS — N201 Calculus of ureter: Secondary | ICD-10-CM | POA: Diagnosis not present

## 2021-02-21 ENCOUNTER — Other Ambulatory Visit: Payer: Self-pay

## 2021-02-21 MED ORDER — PRALUENT 150 MG/ML ~~LOC~~ SOAJ: 150.0000 mg | SUBCUTANEOUS | 11 refills | Status: DC

## 2021-02-22 DIAGNOSIS — S20379A Other superficial bite of unspecified front wall of thorax, initial encounter: Secondary | ICD-10-CM | POA: Diagnosis not present

## 2021-02-22 DIAGNOSIS — W57XXXA Bitten or stung by nonvenomous insect and other nonvenomous arthropods, initial encounter: Secondary | ICD-10-CM | POA: Diagnosis not present

## 2021-03-29 DIAGNOSIS — D225 Melanocytic nevi of trunk: Secondary | ICD-10-CM | POA: Diagnosis not present

## 2021-03-29 DIAGNOSIS — Z85828 Personal history of other malignant neoplasm of skin: Secondary | ICD-10-CM | POA: Diagnosis not present

## 2021-03-29 DIAGNOSIS — L820 Inflamed seborrheic keratosis: Secondary | ICD-10-CM | POA: Diagnosis not present

## 2021-03-29 DIAGNOSIS — D1801 Hemangioma of skin and subcutaneous tissue: Secondary | ICD-10-CM | POA: Diagnosis not present

## 2021-03-29 DIAGNOSIS — S80861A Insect bite (nonvenomous), right lower leg, initial encounter: Secondary | ICD-10-CM | POA: Diagnosis not present

## 2021-03-29 DIAGNOSIS — L821 Other seborrheic keratosis: Secondary | ICD-10-CM | POA: Diagnosis not present

## 2021-07-15 DIAGNOSIS — H52203 Unspecified astigmatism, bilateral: Secondary | ICD-10-CM | POA: Diagnosis not present

## 2021-07-15 DIAGNOSIS — H40013 Open angle with borderline findings, low risk, bilateral: Secondary | ICD-10-CM | POA: Diagnosis not present

## 2021-07-15 DIAGNOSIS — Z961 Presence of intraocular lens: Secondary | ICD-10-CM | POA: Diagnosis not present

## 2021-07-15 DIAGNOSIS — H40053 Ocular hypertension, bilateral: Secondary | ICD-10-CM | POA: Diagnosis not present

## 2021-09-24 ENCOUNTER — Encounter: Payer: Self-pay | Admitting: Cardiovascular Disease

## 2021-09-24 ENCOUNTER — Other Ambulatory Visit: Payer: Self-pay

## 2021-09-24 ENCOUNTER — Ambulatory Visit: Payer: Medicare HMO | Admitting: Cardiovascular Disease

## 2021-09-24 DIAGNOSIS — I2510 Atherosclerotic heart disease of native coronary artery without angina pectoris: Secondary | ICD-10-CM

## 2021-09-24 DIAGNOSIS — E782 Mixed hyperlipidemia: Secondary | ICD-10-CM

## 2021-09-24 DIAGNOSIS — I10 Essential (primary) hypertension: Secondary | ICD-10-CM | POA: Diagnosis not present

## 2021-09-24 LAB — LIPID PANEL
Chol/HDL Ratio: 2.7 ratio (ref 0.0–5.0)
Cholesterol, Total: 129 mg/dL (ref 100–199)
HDL: 48 mg/dL (ref >39)
LDL Chol Calc (NIH): 59 mg/dL (ref 0–99)
Triglycerides: 123 mg/dL (ref 0–149)
VLDL Cholesterol Cal: 22 mg/dL (ref 5–40)

## 2021-09-24 LAB — COMPREHENSIVE METABOLIC PANEL
ALT: 10 IU/L (ref 0–44)
AST: 15 IU/L (ref 0–40)
Albumin/Globulin Ratio: 1.9 (ref 1.2–2.2)
Albumin: 4.1 g/dL (ref 3.7–4.7)
Alkaline Phosphatase: 103 IU/L (ref 44–121)
BUN/Creatinine Ratio: 15 (ref 10–24)
BUN: 13 mg/dL (ref 8–27)
Bilirubin Total: 0.5 mg/dL (ref 0.0–1.2)
CO2: 23 mmol/L (ref 20–29)
Calcium: 8.8 mg/dL (ref 8.6–10.2)
Chloride: 103 mmol/L (ref 96–106)
Creatinine, Ser: 0.87 mg/dL (ref 0.76–1.27)
Globulin, Total: 2.2 g/dL (ref 1.5–4.5)
Glucose: 106 mg/dL — ABNORMAL HIGH (ref 70–99)
Potassium: 4.5 mmol/L (ref 3.5–5.2)
Sodium: 137 mmol/L (ref 134–144)
Total Protein: 6.3 g/dL (ref 6.0–8.5)
eGFR: 92 mL/min/{1.73_m2} (ref >59)

## 2021-09-24 LAB — CBC
Hematocrit: 40 % (ref 37.5–51.0)
Hemoglobin: 12.5 g/dL — ABNORMAL LOW (ref 13.0–17.7)
MCH: 24.5 pg — ABNORMAL LOW (ref 26.6–33.0)
MCHC: 31.3 g/dL — ABNORMAL LOW (ref 31.5–35.7)
MCV: 78 fL — ABNORMAL LOW (ref 79–97)
Platelets: 268 10*3/uL (ref 150–450)
RBC: 5.1 x10E6/uL (ref 4.14–5.80)
RDW: 16.6 % — ABNORMAL HIGH (ref 11.6–15.4)
WBC: 5.9 10*3/uL (ref 3.4–10.8)

## 2021-09-24 LAB — TSH+FREE T4
Free T4: 1.36 ng/dL (ref 0.82–1.77)
TSH: 2.28 u[IU]/mL (ref 0.450–4.500)

## 2021-09-24 NOTE — Assessment & Plan Note (Signed)
History of essential hypertension a blood pressure measured today 120/88.  He is on metoprolol.

## 2021-09-24 NOTE — Patient Instructions (Signed)
Medication Instructions:  Your physician recommends that you continue on your current medications as directed. Please refer to the Current Medication list given to you today.  *If you need a refill on your cardiac medications before your next appointment, please call your pharmacy*   Lab Work: Your physician recommends that you have labs drawn today: CBC, CMET, Cholesterol levels, TSH/Free T4  If you have labs (blood work) drawn today and your tests are completely normal, you will receive your results only by: [place] (if you have MyChart) OR A paper copy in the mail If you have any lab test that is abnormal or we need to change your treatment, we will call you to review the results.   Follow-Up: At [facility], you and your health needs are our priority.  As part of our continuing mission to provide you with exceptional heart care, we have created designated Provider Care Teams.  These Care Teams include your primary Cardiologist (physician) and Advanced Practice Providers (APPs -  Physician Assistants and Nurse Practitioners) who all work together to provide you with the care you need, when you need it.  We recommend signing up for the patient portal called "MyChart".  Sign up information is provided on this After Visit Summary.  MyChart is used to connect with patients for Virtual Visits (Telemedicine).  Patients are able to view lab/test results, encounter notes, upcoming appointments, etc.  Non-urgent messages can be sent to your provider as well.   To learn more about what you can do with MyChart, go to [email or web address].    Your next appointment:   12 month(s)  The format for your next appointment:   In Person  Provider:   Quay Burow, MD

## 2021-09-24 NOTE — Assessment & Plan Note (Signed)
History of CAD status post coronary artery bypass grafting by Dr. Cyndia Bent October 2008 with a LIMA to his LAD, vein to the first second marginal branch sequentially and vein to the PDA.  His EF initially was 35% which ultimately normalized.  He denies chest pain or shortness of breath.

## 2021-09-24 NOTE — Assessment & Plan Note (Signed)
History of hyperlipidemia intolerant to statin therapy on Praluent with lipid profile performed 08/08/2020 revealing total cholesterol 121, LDL 58 and HDL of 45.  We will recheck a fasting lipid and liver profile this morning.

## 2021-09-24 NOTE — Progress Notes (Signed)
09/24/2021 Harold Garner   Sep 05, 1951  [id number]  Primary Physician Maurice Small, MD Primary Cardiologist: Lorretta Harp MD FACP, [place], [place], [place]  HPI:  Harold Garner is a 71 y.o.  moderately overweight married Caucasian male with no children who I last saw 08/08/2020.  His wife Harold Garner unfortunately has been recently diagnosed with Parkinson's disease.  He has a history of CAD status post coronary artery bypass grafting in October of 2008 by Dr. Gilford Raid. He had a LIMA to his LAD, a vein to a 1st and 2nd marginal branch sequentially, and a vein to the PDA. His EF was 35% at that time with followup echo revealing normalization of his ejection fraction 12/08. He also has hyperlipidemia, intolerant to statin drugs. He denies chest pain or shortness of breath. Because of his statin intolerance he was begun on Repatha .  He has had an excellent response to Repatha.   In February 2020 he was complaining of fatigue and shortness of breath.  He was ultimately found to be anemic and underwent transfusion.  He subsequently felt clinically improved.   Since I saw him a year ago he continues to do well.  He denies chest pain or shortness of breath.     Current Meds  Medication Sig   Alirocumab (PRALUENT) 150 MG/ML SOAJ Inject 150 mg into the skin every 14 (fourteen) days.   aspirin 81 MG tablet Take 81 mg by mouth every morning.    esomeprazole (NEXIUM) 20 MG capsule Take 20 mg by mouth daily.    fluticasone (FLONASE) 50 MCG/ACT nasal spray Place 1 spray into both nostrils daily as needed for allergies or rhinitis.   ibuprofen (ADVIL) 200 MG tablet Take 200 mg by mouth every 6 (six) hours as needed for headache or moderate pain.   metoprolol succinate (TOPROL-XL) 25 MG 24 hr tablet TAKE ONE TABLET BY MOUTH DAILY (Patient taking differently: Take 25 mg by mouth daily.)   Multiple Vitamin (MULTIVITAMIN) tablet Take 1 tablet by mouth daily.   naproxen sodium (ALEVE) 220 MG tablet Take 220 mg  by mouth daily as needed (pain).     Allergies  Allergen Reactions   Darvon [Propoxyphene Hcl] Itching   Statins Other (See Comments)    Has tried pravachol, crestor and lipitor, causes muscle pain    Social History   Socioeconomic History   Marital status: Married    Spouse name: Not on file   Number of children: Not on file   Years of education: Not on file   Highest education level: Not on file  Occupational History   Not on file  Tobacco Use   Smoking status: Light Smoker    Types: Cigars   Smokeless tobacco: Never   Tobacco comments:    chew on them mostly  Vaping Use   Vaping Use: Never used  Substance and Sexual Activity   Alcohol use: Yes    Alcohol/week: 3.0 standard drinks    Types: 3 Standard drinks or equivalent per week   Drug use: No   Sexual activity: Not on file  Other Topics Concern   Not on file  Social History Narrative   Not on file   Social Determinants of Health   Financial Resource Strain: Not on file  Food Insecurity: Not on file  Transportation Needs: Not on file  Physical Activity: Not on file  Stress: Not on file  Social Connections: Not on file  Intimate Partner Violence: Not on  file     Review of Systems: General: negative for chills, fever, night sweats or weight changes.  Cardiovascular: negative for chest pain, dyspnea on exertion, edema, orthopnea, palpitations, paroxysmal nocturnal dyspnea or shortness of breath Dermatological: negative for rash Respiratory: negative for cough or wheezing Urologic: negative for hematuria Abdominal: negative for nausea, vomiting, diarrhea, bright red blood per rectum, melena, or hematemesis Neurologic: negative for visual changes, syncope, or dizziness All other systems reviewed and are otherwise negative except as noted above.    Blood pressure 120/88, pulse 61, height 6\' 1"  (1.854 m), weight 269 lb 9.6 oz (122.3 kg), SpO2 96 %.  General appearance: alert and no distress Neck: no  adenopathy, no carotid bruit, no JVD, supple, symmetrical, trachea midline, and thyroid not enlarged, symmetric, no tenderness/mass/nodules Lungs: clear to auscultation bilaterally Heart: regular rate and rhythm, S1, S2 normal, no murmur, click, rub or gallop Extremities: extremities normal, atraumatic, no cyanosis or edema Pulses: 2+ and symmetric Skin: Skin color, texture, turgor normal. No rashes or lesions Neurologic: Grossly normal  EKG sinus rhythm at 61 with nonspecific ST and T wave changes.  I personally reviewed this EKG.  ASSESSMENT AND PLAN:   Essential hypertension History of essential hypertension a blood pressure measured today 120/88.  He is on metoprolol.  Hyperlipidemia History of hyperlipidemia intolerant to statin therapy on Praluent with lipid profile performed 08/08/2020 revealing total cholesterol 121, LDL 58 and HDL of 45.  We will recheck a fasting lipid and liver profile this morning.  Coronary artery disease History of CAD status post coronary artery bypass grafting by Dr. Cyndia Bent October 2008 with a LIMA to his LAD, vein to the first second marginal branch sequentially and vein to the PDA.  His EF initially was 35% which ultimately normalized.  He denies chest pain or shortness of breath.     Lorretta Harp MD FACP,FACC,FAHA, [facility] 09/24/2021 10:04 AM

## 2021-10-15 ENCOUNTER — Other Ambulatory Visit: Payer: Self-pay

## 2021-10-15 MED ORDER — METOPROLOL SUCCINATE ER 25 MG PO TB24: 25.0000 mg | ORAL_TABLET | Freq: Every day | ORAL | 3 refills | Status: DC

## 2022-01-24 DIAGNOSIS — H4052X0 Glaucoma secondary to other eye disorders, left eye, stage unspecified: Secondary | ICD-10-CM | POA: Diagnosis not present

## 2022-03-07 DIAGNOSIS — J0100 Acute maxillary sinusitis, unspecified: Secondary | ICD-10-CM | POA: Diagnosis not present

## 2022-04-04 DIAGNOSIS — Z85828 Personal history of other malignant neoplasm of skin: Secondary | ICD-10-CM | POA: Diagnosis not present

## 2022-04-04 DIAGNOSIS — L821 Other seborrheic keratosis: Secondary | ICD-10-CM | POA: Diagnosis not present

## 2022-04-04 DIAGNOSIS — L820 Inflamed seborrheic keratosis: Secondary | ICD-10-CM | POA: Diagnosis not present

## 2022-04-04 DIAGNOSIS — L814 Other melanin hyperpigmentation: Secondary | ICD-10-CM | POA: Diagnosis not present

## 2022-04-04 DIAGNOSIS — D2361 Other benign neoplasm of skin of right upper limb, including shoulder: Secondary | ICD-10-CM | POA: Diagnosis not present

## 2022-04-04 DIAGNOSIS — D2372 Other benign neoplasm of skin of left lower limb, including hip: Secondary | ICD-10-CM | POA: Diagnosis not present

## 2022-04-04 DIAGNOSIS — L298 Other pruritus: Secondary | ICD-10-CM | POA: Diagnosis not present

## 2022-05-28 ENCOUNTER — Telehealth: Payer: Self-pay | Admitting: Cardiovascular Disease

## 2022-05-28 DIAGNOSIS — E782 Mixed hyperlipidemia: Secondary | ICD-10-CM

## 2022-05-28 DIAGNOSIS — I2510 Atherosclerotic heart disease of native coronary artery without angina pectoris: Secondary | ICD-10-CM

## 2022-05-28 NOTE — Telephone Encounter (Signed)
*  STAT* If patient is at the pharmacy, call can be transferred to refill team.   1. Which medications need to be refilled? (please list name of each medication and dose if known)  Alirocumab (PRALUENT) 150 MG/ML SOAJ  2. Which pharmacy/location (including street and city if local pharmacy) is medication to be sent to? [facility] [phone] - [place], [address]  3. Do they need a 30 day or 90 day supply? 30 day   Patient is out of medication

## 2022-05-29 MED ORDER — PRALUENT 150 MG/ML ~~LOC~~ SOAJ: 150.0000 mg | SUBCUTANEOUS | 11 refills | Status: DC

## 2022-05-29 NOTE — Telephone Encounter (Signed)
Pharmacy is calling to check on the status of prescription due to wife contacting them today to check on it. Please advise.

## 2022-08-25 DIAGNOSIS — H4052X1 Glaucoma secondary to other eye disorders, left eye, mild stage: Secondary | ICD-10-CM | POA: Diagnosis not present

## 2022-08-25 DIAGNOSIS — Z961 Presence of intraocular lens: Secondary | ICD-10-CM | POA: Diagnosis not present

## 2022-08-25 DIAGNOSIS — H52203 Unspecified astigmatism, bilateral: Secondary | ICD-10-CM | POA: Diagnosis not present

## 2022-08-25 DIAGNOSIS — H40011 Open angle with borderline findings, low risk, right eye: Secondary | ICD-10-CM | POA: Diagnosis not present

## 2022-10-07 ENCOUNTER — Other Ambulatory Visit: Payer: Self-pay | Admitting: Nurse Practitioner

## 2022-10-28 ENCOUNTER — Encounter: Payer: Self-pay | Admitting: Cardiovascular Disease

## 2022-10-28 ENCOUNTER — Ambulatory Visit: Payer: Medicare HMO | Attending: Cardiovascular Disease | Admitting: Cardiovascular Disease

## 2022-10-28 ENCOUNTER — Telehealth: Payer: Self-pay | Admitting: Pharmacist

## 2022-10-28 VITALS — BP 140/90 | HR 64 | Ht 75.0 in | Wt 268.0 lb

## 2022-10-28 DIAGNOSIS — I2510 Atherosclerotic heart disease of native coronary artery without angina pectoris: Secondary | ICD-10-CM | POA: Diagnosis not present

## 2022-10-28 DIAGNOSIS — E782 Mixed hyperlipidemia: Secondary | ICD-10-CM

## 2022-10-28 DIAGNOSIS — I10 Essential (primary) hypertension: Secondary | ICD-10-CM

## 2022-10-28 NOTE — Assessment & Plan Note (Signed)
History of CAD status post coronary artery bypass grafting October 2008 by Dr. Cyndia Bent with a LIMA to his LAD, vein to the first and second marginal branch sequentially and a vein to the PDA.  His EF initially was 35% which normalized by 2D echo 12/08.  He is completely asymptomatic.

## 2022-10-28 NOTE — Assessment & Plan Note (Signed)
History of hyperlipidemia on Praluent.  His most recent lipid profile performed 09/24/2021 revealed total cholesterol 129, LDL 59 and HDL 48.

## 2022-10-28 NOTE — Patient Instructions (Addendum)
Medication Instructions:  Your physician recommends that you continue on your current medications as directed. Please refer to the Current Medication list given to you today.  *If you need a refill on your cardiac medications before your next appointment, please call your pharmacy*   Follow-Up: At [facility], you and your health needs are our priority.  As part of our continuing mission to provide you with exceptional heart care, we have created designated Provider Care Teams.  These Care Teams include your primary Cardiologist (physician) and Advanced Practice Providers (APPs -  Physician Assistants and Nurse Practitioners) who all work together to provide you with the care you need, when you need it.  We recommend signing up for the patient portal called "MyChart".  Sign up information is provided on this After Visit Summary.  MyChart is used to connect with patients for Virtual Visits (Telemedicine).  Patients are able to view lab/test results, encounter notes, upcoming appointments, etc.  Non-urgent messages can be sent to your provider as well.   To learn more about what you can do with MyChart, go to [email or web address].    Your next appointment:   12 month(s)  Provider:   Quay Burow, MD

## 2022-10-28 NOTE — Progress Notes (Signed)
10/28/2022 [name]   October 06, 1950  [id number]  Primary Physician Maurice Small, MD Primary Cardiologist: Lorretta Harp MD FACP, [place], [place], [place]  HPI:  Harold Garner is a 72 y.o.  oderately overweight married Caucasian male with no children who I last saw 09/24/2021.  His wife [name] unfortunately has been diagnosed with Parkinson's disease which apparently has been progressive and he is very concerned about.Marland Kitchen  He has a history of CAD status post coronary artery bypass grafting in October of 2008 by Dr. Gilford Raid. He had a LIMA to his LAD, a vein to a 1st and 2nd marginal branch sequentially, and a vein to the PDA. His EF was 35% at that time with followup echo revealing normalization of his ejection fraction 12/08. He also has hyperlipidemia, intolerant to statin drugs. He denies chest pain or shortness of breath. Because of his statin intolerance he was begun on Repatha .  He has had an excellent response to Repatha.   In February 2020 he was complaining of fatigue and shortness of breath.  He was ultimately found to be anemic and underwent transfusion.  He subsequently felt clinically improved.   Since I saw him a year ago he continues to do well.  He denies chest pain or shortness of breath.  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     Current Meds  Medication Sig   Alirocumab (PRALUENT) 150 MG/ML SOAJ Inject 150 mg into the skin every 14 (fourteen) days.   aspirin 81 MG tablet Take 81 mg by mouth every morning.    esomeprazole (NEXIUM) 20 MG capsule Take 20 mg by mouth daily.    metoprolol succinate (TOPROL-XL) 25 MG 24 hr tablet Take 1 tablet (25 mg total) by mouth daily. Please keep scheduled appointment   Multiple Vitamin (MULTIVITAMIN) tablet Take 1 tablet by mouth daily.     Allergies  Allergen Reactions   Darvon [Propoxyphene Hcl] Itching   Statins Other (See Comments)    Has tried pravachol, crestor and lipitor, causes muscle  pain    Social History   Socioeconomic History   Marital status: Married    Spouse name: Not on file   Number of children: Not on file   Years of education: Not on file   Highest education level: Not on file  Occupational History   Not on file  Tobacco Use   Smoking status: Never   Smokeless tobacco: Never   Tobacco comments:    10/24/2022 Patient chew on cigars  Vaping Use   Vaping Use: Never used  Substance and Sexual Activity   Alcohol use: Yes    Alcohol/week: 3.0 standard drinks of alcohol    Types: 3 Standard drinks or equivalent per week   Drug use: No   Sexual activity: Not on file  Other Topics Concern   Not on file  Social History Narrative   Not on file   Social Determinants of Health   Financial Resource Strain: Not on file  Food Insecurity: Not on file  Transportation Needs: Not on file  Physical Activity: Not on file  Stress: Not on file  Social Connections: Not on file  Intimate Partner Violence: Not on file     Review of Systems: General: negative for chills, fever, night sweats or weight changes.  Cardiovascular: negative for chest pain, dyspnea on exertion, edema, orthopnea, palpitations, paroxysmal nocturnal dyspnea or shortness of breath Dermatological: negative for rash Respiratory:  negative for cough or wheezing Urologic: negative for hematuria Abdominal: negative for nausea, vomiting, diarrhea, bright red blood per rectum, melena, or hematemesis Neurologic: negative for visual changes, syncope, or dizziness All other systems reviewed and are otherwise negative except as noted above.    Blood pressure (!) 140/90, pulse 64, height '6\' 3"'$  (1.905 m), weight 268 lb (121.6 kg), SpO2 94 %.  General appearance: alert and no distress Neck: no adenopathy, no carotid bruit, no JVD, supple, symmetrical, trachea midline, and thyroid not enlarged, symmetric, no tenderness/mass/nodules Lungs: clear to auscultation bilaterally Heart: regular rate and  rhythm, S1, S2 normal, no murmur, click, rub or gallop Extremities: extremities normal, atraumatic, no cyanosis or edema Pulses: 2+ and symmetric Skin: Skin color, texture, turgor normal. No rashes or lesions Neurologic: Grossly normal  EKG sinus rhythm at 64 with LVH voltage and occasional PACs.  I personally reviewed this EKG.  ASSESSMENT AND PLAN:   Essential hypertension History of essential hypertension blood pressure measured today at 140/90.  He is on metoprolol.  He does not check his blood pressure at home.Marland Kitchen  Hyperlipidemia History of hyperlipidemia on Praluent.  His most recent lipid profile performed 09/24/2021 revealed total cholesterol 129, LDL 59 and HDL 48.  Coronary artery disease History of CAD status post coronary artery bypass grafting October 2008 by Dr. Cyndia Bent with a LIMA to his LAD, vein to the first and second marginal branch sequentially and a vein to the PDA.  His EF initially was 35% which normalized by 2D echo 12/08.  He is completely asymptomatic.     Lorretta Harp MD FACP,FACC,FAHA, [facility] 10/28/2022 10:17 AM

## 2022-10-28 NOTE — Telephone Encounter (Signed)
Patient was in the office to see Dr. Gwenlyn Found today. Reported he can not afford Praluent anymore. They charged $110 for the last fill.  Called pharmacy to confirm it is still covered  enroll patient in [place] to get co-pay assistance.   ID [id number]   BIN [id number]   PCN [id number]   PC GROUP [id number]  Call pharmacy again to confirm they are able to run the co-pay card without trouble.  Pharmacy staff confirmed that his co-pay went down to $0 and hey will contact him once the prescription get ready for him.

## 2022-10-28 NOTE — Assessment & Plan Note (Signed)
History of essential hypertension blood pressure measured today at 140/90.  He is on metoprolol.  He does not check his blood pressure at home.Harold Garner

## 2022-11-28 DIAGNOSIS — R0981 Nasal congestion: Secondary | ICD-10-CM | POA: Diagnosis not present

## 2022-11-28 DIAGNOSIS — R051 Acute cough: Secondary | ICD-10-CM | POA: Diagnosis not present

## 2022-11-28 DIAGNOSIS — J069 Acute upper respiratory infection, unspecified: Secondary | ICD-10-CM | POA: Diagnosis not present

## 2022-11-28 DIAGNOSIS — Z03818 Encounter for observation for suspected exposure to other biological agents ruled out: Secondary | ICD-10-CM | POA: Diagnosis not present

## 2022-12-04 ENCOUNTER — Other Ambulatory Visit: Payer: Self-pay

## 2022-12-04 MED ORDER — METOPROLOL SUCCINATE ER 25 MG PO TB24: 25.0000 mg | ORAL_TABLET | Freq: Every day | ORAL | 3 refills | Status: DC

## 2022-12-30 ENCOUNTER — Telehealth: Payer: Self-pay

## 2022-12-30 ENCOUNTER — Other Ambulatory Visit (HOSPITAL_COMMUNITY): Payer: Self-pay

## 2022-12-30 MED ORDER — REPATHA SURECLICK 140 MG/ML ~~LOC~~ SOAJ: 140.0000 mg | SUBCUTANEOUS | 3 refills | Status: DC

## 2022-12-30 NOTE — Telephone Encounter (Signed)
Patient informed about change and prescription sent to the pharmacy

## 2022-12-30 NOTE — Telephone Encounter (Signed)
Pharmacy Patient Advocate Encounter   Received notification from [facility] that prior authorization for Repatha 140mg /ml is required/requested DUE to pts plan no longer covering the pts previous Rx Praluent.     PA submitted on 4.9.24 to (ins) [employer] via CoverMyMeds   Key # [id number]  Status is pending

## 2022-12-30 NOTE — Telephone Encounter (Signed)
  Will update pharmacy with determination

## 2022-12-30 NOTE — Telephone Encounter (Signed)
Pharmacy Patient Advocate Encounter  Prior Authorization for Repatha 140mg /ml has been approved by [employer] (ins).     Effective dates: 1.1.24 through 12.31.24  Please note that the pt will need a New RX for Repatha 140mg /ml, as Praluent is no longer listed on the plan formulary

## 2023-03-06 DIAGNOSIS — H4052X1 Glaucoma secondary to other eye disorders, left eye, mild stage: Secondary | ICD-10-CM | POA: Diagnosis not present

## 2023-03-06 DIAGNOSIS — H40011 Open angle with borderline findings, low risk, right eye: Secondary | ICD-10-CM | POA: Diagnosis not present

## 2023-04-10 DIAGNOSIS — Z85828 Personal history of other malignant neoplasm of skin: Secondary | ICD-10-CM | POA: Diagnosis not present

## 2023-04-10 DIAGNOSIS — L814 Other melanin hyperpigmentation: Secondary | ICD-10-CM | POA: Diagnosis not present

## 2023-04-10 DIAGNOSIS — D1801 Hemangioma of skin and subcutaneous tissue: Secondary | ICD-10-CM | POA: Diagnosis not present

## 2023-04-10 DIAGNOSIS — L309 Dermatitis, unspecified: Secondary | ICD-10-CM | POA: Diagnosis not present

## 2023-04-10 DIAGNOSIS — L821 Other seborrheic keratosis: Secondary | ICD-10-CM | POA: Diagnosis not present

## 2023-04-10 DIAGNOSIS — L570 Actinic keratosis: Secondary | ICD-10-CM | POA: Diagnosis not present

## 2023-04-10 DIAGNOSIS — L820 Inflamed seborrheic keratosis: Secondary | ICD-10-CM | POA: Diagnosis not present

## 2023-04-10 DIAGNOSIS — D235 Other benign neoplasm of skin of trunk: Secondary | ICD-10-CM | POA: Diagnosis not present

## 2023-04-10 DIAGNOSIS — D692 Other nonthrombocytopenic purpura: Secondary | ICD-10-CM | POA: Diagnosis not present

## 2023-08-27 DIAGNOSIS — H52203 Unspecified astigmatism, bilateral: Secondary | ICD-10-CM | POA: Diagnosis not present

## 2023-08-27 DIAGNOSIS — H4052X2 Glaucoma secondary to other eye disorders, left eye, moderate stage: Secondary | ICD-10-CM | POA: Diagnosis not present

## 2023-08-27 DIAGNOSIS — Z961 Presence of intraocular lens: Secondary | ICD-10-CM | POA: Diagnosis not present

## 2023-11-19 DIAGNOSIS — R3121 Asymptomatic microscopic hematuria: Secondary | ICD-10-CM | POA: Diagnosis not present

## 2023-11-19 DIAGNOSIS — R350 Frequency of micturition: Secondary | ICD-10-CM | POA: Diagnosis not present

## 2023-11-19 DIAGNOSIS — R300 Dysuria: Secondary | ICD-10-CM | POA: Diagnosis not present

## 2023-11-19 DIAGNOSIS — R8271 Bacteriuria: Secondary | ICD-10-CM | POA: Diagnosis not present

## 2023-12-03 ENCOUNTER — Other Ambulatory Visit: Payer: Self-pay | Admitting: Cardiovascular Disease

## 2024-01-02 ENCOUNTER — Other Ambulatory Visit: Payer: Self-pay | Admitting: Cardiovascular Disease

## 2024-01-20 ENCOUNTER — Encounter: Payer: Self-pay | Admitting: Cardiovascular Disease

## 2024-01-20 ENCOUNTER — Ambulatory Visit: Attending: Cardiovascular Disease | Admitting: Cardiovascular Disease

## 2024-01-20 VITALS — BP 130/82 | HR 82 | Ht 75.0 in | Wt 270.0 lb

## 2024-01-20 DIAGNOSIS — I2510 Atherosclerotic heart disease of native coronary artery without angina pectoris: Secondary | ICD-10-CM

## 2024-01-20 DIAGNOSIS — E782 Mixed hyperlipidemia: Secondary | ICD-10-CM | POA: Diagnosis not present

## 2024-01-20 DIAGNOSIS — I10 Essential (primary) hypertension: Secondary | ICD-10-CM | POA: Diagnosis not present

## 2024-01-20 LAB — CBC

## 2024-01-20 NOTE — Assessment & Plan Note (Signed)
 History of essential hypertension blood pressure measured today at 130/82.  He is on metoprolol .

## 2024-01-20 NOTE — Patient Instructions (Signed)
 Medication Instructions:  Your physician recommends that you continue on your current medications as directed. Please refer to the Current Medication list given to you today.  *If you need a refill on your cardiac medications before your next appointment, please call your pharmacy*  Lab Work: Your physician recommends that you have labs drawn today: CMET, CBC, TSH, Lipids  If you have labs (blood work) drawn today and your tests are completely normal, you will receive your results only by: MyChart Message (if you have MyChart) OR A paper copy in the mail If you have any lab test that is abnormal or we need to change your treatment, we will call you to review the results.   Follow-Up: At [facility], you and your health needs are our priority.  As part of our continuing mission to provide you with exceptional heart care, our providers are all part of one team.  This team includes your primary Cardiologist (physician) and Advanced Practice Providers or APPs (Physician Assistants and Nurse Practitioners) who all work together to provide you with the care you need, when you need it.  Your next appointment:   12 month(s)  Provider:   Lauro Portal, MD    We recommend signing up for the patient portal called "MyChart".  Sign up information is provided on this After Visit Summary.  MyChart is used to connect with patients for Virtual Visits (Telemedicine).  Patients are able to view lab/test results, encounter notes, upcoming appointments, etc.  Non-urgent messages can be sent to your provider as well.   To learn more about what you can do with MyChart, go to [email or web address].

## 2024-01-20 NOTE — Assessment & Plan Note (Signed)
 History of CAD status post coronary artery bypass grafting October 2008 by Dr. Sherene Dilling.  He had a LIMA to his LAD, vein to 1st and 2nd marginal branch and a vein to the PDA.  His initial EF was 35% but his follow-up echo showed normalization.  He has been completely asymptomatic.

## 2024-01-20 NOTE — Assessment & Plan Note (Signed)
 History of hyperlipidemia on Repatha .  Will recheck a lipid liver profile.

## 2024-01-20 NOTE — Progress Notes (Signed)
 01/20/2024 Harold Garner   1950/12/14  [id number]  Primary Physician Patient, No Pcp Per Primary Cardiologist: Avanell Leigh MD FACP, [facility], [place], [place]  HPI:  Harold Garner is a 73 y.o.   moderately overweight married Caucasian male with no children who I last saw 10/28/2022.  His wife Harold Garner unfortunately has been recently diagnosed with Parkinson's disease.  He still works at a Insurance claims handler.  He has a history of CAD status post coronary artery bypass grafting in October of 2008 by Dr. Linder Revere. He had a LIMA to his LAD, a vein to a 1st and 2nd marginal branch sequentially, and a vein to the PDA. His EF was 35% at that time with followup echo revealing normalization of his ejection fraction 12/08. He also has hyperlipidemia, intolerant to statin drugs. He denies chest pain or shortness of breath. Because of his statin intolerance he was begun on Repatha  .  He has had an excellent response to Repatha .   In February 2020 he was complaining of fatigue and shortness of breath.  He was ultimately found to be anemic and underwent transfusion.  He subsequently felt clinically improved.   Since I saw him a year ago he continues to do well.  He denies chest pain or shortness of breath.     Current Meds  Medication Sig   aspirin 81 MG tablet Take 81 mg by mouth every morning.    brimonidine (ALPHAGAN) 0.2 % ophthalmic solution SMARTSIG:In Eye(s)   esomeprazole (NEXIUM) 20 MG capsule Take 20 mg by mouth daily.    Evolocumab  (REPATHA  SURECLICK) 140 MG/ML SOAJ Inject 140 mg into the skin every 14 (fourteen) days.   fluticasone (FLONASE) 50 MCG/ACT nasal spray Place 1 spray into both nostrils daily as needed for allergies or rhinitis.   ibuprofen (ADVIL) 200 MG tablet Take 200 mg by mouth every 6 (six) hours as needed for headache or moderate pain (pain score 4-6).   ketorolac (TORADOL) 10 MG tablet Take 10 mg by mouth every 8 (eight) hours as needed for moderate pain (pain score 4-6).    metoprolol  succinate (TOPROL -XL) 25 MG 24 hr tablet TAKE 1 TABLET BY MOUTH DAILY   Multiple Vitamin (MULTIVITAMIN) tablet Take 1 tablet by mouth daily.   naproxen sodium (ALEVE) 220 MG tablet Take 220 mg by mouth daily as needed (pain).   tamsulosin  (FLOMAX ) 0.4 MG CAPS capsule Take 0.4 mg by mouth daily.   timolol (TIMOPTIC) 0.5 % ophthalmic solution SMARTSIG:In Eye(s)     Allergies  Allergen Reactions   Darvon [Propoxyphene Hcl] Itching   Statins Other (See Comments)    Has tried pravachol, crestor  and lipitor, causes muscle pain    Social History   Socioeconomic History   Marital status: Married    Spouse name: Not on file   Number of children: Not on file   Years of education: Not on file   Highest education level: Not on file  Occupational History   Not on file  Tobacco Use   Smoking status: Never   Smokeless tobacco: Never   Tobacco comments:    10/24/2022 Patient chew on cigars  Vaping Use   Vaping status: Never Used  Substance and Sexual Activity   Alcohol use: Yes    Alcohol/week: 3.0 standard drinks of alcohol    Types: 3 Standard drinks or equivalent per week   Drug use: No   Sexual activity: Not on file  Other Topics Concern   Not  on file  Social History Narrative   Not on file   Social Drivers of Health   Financial Resource Strain: Not on file  Food Insecurity: Not on file  Transportation Needs: Not on file  Physical Activity: Not on file  Stress: Not on file  Social Connections: Not on file  Intimate Partner Violence: Not on file     Review of Systems: General: negative for chills, fever, night sweats or weight changes.  Cardiovascular: negative for chest pain, dyspnea on exertion, edema, orthopnea, palpitations, paroxysmal nocturnal dyspnea or shortness of breath Dermatological: negative for rash Respiratory: negative for cough or wheezing Urologic: negative for hematuria Abdominal: negative for nausea, vomiting, diarrhea, bright red blood per  rectum, melena, or hematemesis Neurologic: negative for visual changes, syncope, or dizziness All other systems reviewed and are otherwise negative except as noted above.    Blood pressure 130/82, pulse 82, height 6\' 3"  (1.905 m), weight 270 lb (122.5 kg), SpO2 96%.  General appearance: alert and no distress Neck: no adenopathy, no carotid bruit, no JVD, supple, symmetrical, trachea midline, and thyroid  not enlarged, symmetric, no tenderness/mass/nodules Lungs: clear to auscultation bilaterally Heart: regular rate and rhythm, S1, S2 normal, no murmur, click, rub or gallop Extremities: extremities normal, atraumatic, no cyanosis or edema Pulses: 2+ and symmetric Skin: Skin color, texture, turgor normal. No rashes or lesions Neurologic: Grossly normal  EKG EKG Interpretation Date/Time:  Wednesday January 20 2024 08:20:23 EDT Ventricular Rate:  60 PR Interval:  152 QRS Duration:  94 QT Interval:  414 QTC Calculation: 414 R Axis:   14  Text Interpretation: Sinus rhythm with marked sinus arrhythmia Nonspecific ST and T wave abnormality When compared with ECG of 02-Jul-2007 07:41, Criteria for Inferior infarct are no longer Present T wave inversion no longer evident in Inferior leads T wave inversion less evident in Lateral leads Confirmed by Lauro Portal [phone]) on 01/20/2024 8:25:52 AM    ASSESSMENT AND PLAN:   Essential hypertension History of essential hypertension blood pressure measured today at 130/82.  He is on metoprolol .  Hyperlipidemia History of hyperlipidemia on Repatha .  Will recheck a lipid liver profile.  Coronary artery disease History of CAD status post coronary artery bypass grafting October 2008 by Dr. Sherene Dilling.  He had a LIMA to his LAD, vein to 1st and 2nd marginal branch and a vein to the PDA.  His initial EF was 35% but his follow-up echo showed normalization.  He has been completely asymptomatic.     Avanell Leigh MD FACP,FACC,FAHA,  [facility] 01/20/2024 8:36 AM

## 2024-01-21 ENCOUNTER — Encounter: Payer: Self-pay | Admitting: *Deleted

## 2024-01-21 LAB — COMPREHENSIVE METABOLIC PANEL WITH GFR
ALT: 12 IU/L (ref 0–44)
AST: 17 IU/L (ref 0–40)
Albumin: 4 g/dL (ref 3.8–4.8)
Alkaline Phosphatase: 93 IU/L (ref 44–121)
BUN/Creatinine Ratio: 16 (ref 10–24)
BUN: 16 mg/dL (ref 8–27)
Bilirubin Total: 0.4 mg/dL (ref 0.0–1.2)
CO2: 23 mmol/L (ref 20–29)
Calcium: 9.1 mg/dL (ref 8.6–10.2)
Chloride: 105 mmol/L (ref 96–106)
Creatinine, Ser: 1 mg/dL (ref 0.76–1.27)
Globulin, Total: 2.6 g/dL (ref 1.5–4.5)
Glucose: 97 mg/dL (ref 70–99)
Potassium: 4.6 mmol/L (ref 3.5–5.2)
Sodium: 143 mmol/L (ref 134–144)
Total Protein: 6.6 g/dL (ref 6.0–8.5)
eGFR: 79 mL/min/{1.73_m2} (ref >59)

## 2024-01-21 LAB — LIPID PANEL
Chol/HDL Ratio: 2.9 ratio (ref 0.0–5.0)
Cholesterol, Total: 137 mg/dL (ref 100–199)
HDL: 47 mg/dL (ref >39)
LDL Chol Calc (NIH): 71 mg/dL (ref 0–99)
Triglycerides: 105 mg/dL (ref 0–149)
VLDL Cholesterol Cal: 19 mg/dL (ref 5–40)

## 2024-01-21 LAB — CBC
Hematocrit: 40.6 % (ref 37.5–51.0)
Hemoglobin: 12.3 g/dL — ABNORMAL LOW (ref 13.0–17.7)
MCH: 25.5 pg — ABNORMAL LOW (ref 26.6–33.0)
MCHC: 30.3 g/dL — ABNORMAL LOW (ref 31.5–35.7)
MCV: 84 fL (ref 79–97)
Platelets: 243 10*3/uL (ref 150–450)
RBC: 4.82 x10E6/uL (ref 4.14–5.80)
RDW: 14.7 % (ref 11.6–15.4)
WBC: 5.2 10*3/uL (ref 3.4–10.8)

## 2024-01-21 LAB — TSH: TSH: 2.71 u[IU]/mL (ref 0.450–4.500)

## 2024-01-31 ENCOUNTER — Other Ambulatory Visit: Payer: Self-pay | Admitting: Cardiovascular Disease

## 2024-02-01 DIAGNOSIS — J0100 Acute maxillary sinusitis, unspecified: Secondary | ICD-10-CM | POA: Diagnosis not present

## 2024-03-04 DIAGNOSIS — Z961 Presence of intraocular lens: Secondary | ICD-10-CM | POA: Diagnosis not present

## 2024-03-04 DIAGNOSIS — H4052X2 Glaucoma secondary to other eye disorders, left eye, moderate stage: Secondary | ICD-10-CM | POA: Diagnosis not present

## 2024-03-04 DIAGNOSIS — H53002 Unspecified amblyopia, left eye: Secondary | ICD-10-CM | POA: Diagnosis not present

## 2024-04-22 ENCOUNTER — Other Ambulatory Visit: Payer: Self-pay | Admitting: Cardiovascular Disease

## 2024-06-28 ENCOUNTER — Telehealth: Payer: Self-pay | Admitting: Cardiovascular Disease

## 2024-06-28 NOTE — Telephone Encounter (Signed)
 Spoke with pt and advised of Dr Ranee recommendation to be seen.  Appointment scheduled for 07/01/24 at 1120am with Lum Louis, NP

## 2024-06-28 NOTE — Telephone Encounter (Signed)
 Pt c/o swelling/edema: STAT if pt has developed SOB within 24 hours  If swelling, where is the swelling located?   Feet and lower legs  How much weight have you gained and in what time span?   No  Have you gained 2 pounds in a day or 5 pounds in a week?   No  Do you have a log of your daily weights (if so, list)? No  Are you currently taking a fluid pill?   No  Are you currently SOB?   No  Have you traveled recently in a car or plane for an extended period of time?   No  Patient stated his legs and feet started swelling toward the end of the summer and he had been drinking Liquid IV drinks to stay hydrated.

## 2024-06-28 NOTE — Telephone Encounter (Signed)
 Spoke with pt who complains of bilateral LEE x 3-4 weeks.  Denies redness or weeping.  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  Pt is on his feet all day and is unable to elevate until evening.  He has not tried compression stockings.  Is now watching sodium intake but edema remains.  He denies current CP, SOB or dizziness.  He is taking medications as prescribed.  Does not check his BP at home.  Pt last seen by Dr Court 4/25. Pt advised will forward to Dr Court for further review and recommendation.  Pt verbalizes understanding and agrees with current plan.

## 2024-07-01 ENCOUNTER — Encounter: Payer: Self-pay | Admitting: Emergency Medicine

## 2024-07-01 ENCOUNTER — Ambulatory Visit: Attending: Emergency Medicine | Admitting: Emergency Medicine

## 2024-07-01 VITALS — BP 136/80 | HR 84 | Ht 75.0 in | Wt 281.8 lb

## 2024-07-01 DIAGNOSIS — I2510 Atherosclerotic heart disease of native coronary artery without angina pectoris: Secondary | ICD-10-CM

## 2024-07-01 DIAGNOSIS — I10 Essential (primary) hypertension: Secondary | ICD-10-CM

## 2024-07-01 DIAGNOSIS — R600 Localized edema: Secondary | ICD-10-CM

## 2024-07-01 DIAGNOSIS — E785 Hyperlipidemia, unspecified: Secondary | ICD-10-CM

## 2024-07-01 MED ORDER — FUROSEMIDE 40 MG PO TABS: 40.0000 mg | ORAL_TABLET | Freq: Every day | ORAL | 3 refills | Status: AC

## 2024-07-01 MED ORDER — POTASSIUM CHLORIDE CRYS ER 20 MEQ PO TBCR: 20.0000 meq | EXTENDED_RELEASE_TABLET | Freq: Every day | ORAL | 3 refills | Status: AC

## 2024-07-01 NOTE — Progress Notes (Addendum)
 Cardiology Office Note:    Date:  07/18/2024  ID:  ASLAN HIMES, DOB 09-16-1951, MRN [id number] PCP: Patient, No Pcp Per  [facility] Providers Cardiologist:  Dorn Lesches, MD Cardiology APP:  Rana Lum CROME, NP       Patient Profile:       Chief Complaint: Acute visit for lower extremity edema History of Present Illness:  ALISTER STAVER is a 73 y.o. male with visit-pertinent history of hypertension, hyperlipidemia, coronary artery disease  He has history of coronary artery disease status post CABG in October 2008 with LIMA to LAD, SVG to 1st and 2nd marginal branch sequentially, and SVG to PDA.  His EF was 35% at the time of follow-up echocardiogram revealing normalization of ejection fraction in 12/08.  He has history of statin intolerance and is managed on Repatha .  In February 2020 he was complaining of fatigue and shortness of breath.  He was ultimately found to be anemic and underwent transfusion which subsequently improved his symptoms.  He was last seen in clinic on 01/20/2024 he was doing well at the time and asymptomatic.  He was to follow-up in 1 year.  He called into the nurse triage line on 06/28/2024 with complaints of bilateral lower extremity edema for 3 to 4 weeks.  He was scheduled for an in office visit.   Discussed the use of AI scribe software for clinical note transcription with the patient, who gave verbal consent to proceed.  History of Present Illness Gryphon Vanderveen is a 73 year old male with coronary artery disease who presents with leg swelling.  He experiences bilateral leg swelling for the past three to four months, worsening during the day, especially when on his feet at work. The swelling decreases over the weekends with less activity and is slightly better in the mornings.  He has gained eleven pounds since April and had been consuming high sodium drinks like Gatorade and Liquid IV however this past summer, which he has since  reduced, noticing some improvement in swelling.  Overall he besides the leg swelling he remains fairly asymptomatic.  No chest pains, dyspnea, orthopnea, PND.  No syncope, presyncope, lightheadedness, dizziness, melena.  He has not been weighing himself daily, has never taken a diuretic, and does not currently wear compression socks, though he has used them in the past.  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   Review of systems:  Please see the history of present illness. All other systems are reviewed and otherwise negative.      Studies Reviewed:          Risk Assessment/Calculations:             Physical Exam:   VS:  BP 136/80   Pulse 84   Ht 6' 3 (1.905 m)   Wt 281 lb 12.8 oz (127.8 kg)   SpO2 95%   BMI 35.22 kg/m    Wt Readings from Last 3 Encounters:  07/01/24 281 lb 12.8 oz (127.8 kg)  01/20/24 270 lb (122.5 kg)  10/28/22 268 lb (121.6 kg)    GEN: Well nourished, well developed in no acute distress NECK: No JVD; No carotid bruits CARDIAC: RRR, no murmurs, rubs, gallops RESPIRATORY:  Clear to auscultation without rales, wheezing or rhonchi  ABDOMEN: Soft, non-tender, non-distended EXTREMITIES: 1+ bilateral LEE; No acute deformity      Assessment and Plan:  Coronary artery disease S/p CABG in  October 2008 with LIMA-LAD, SVG-1st & 2nd marginal branch sequentially, SVG-PDA - Today patient is without chest pains and stable.  He remains active at work without exertional symptoms.  No symptoms to suggest active angina.  No indication for further ischemic evaluation at this time - Has only been taking his aspirin as needed due to bruising 1-2 times a week.  The bruising seems to have been minimal.  I have asked him to restart his aspirin 81 mg daily  Hyperlipidemia LDL 71 on 12/2023 - Continue Repatha  140 mg q. 14 days  Hypertension Blood pressure today well-controlled 136/80 - Continue metoprolol  succinate 25 mg  daily  Lower extremity edema Reports new onset lower extremity edema over the past 3 to 4 months.  Started over the summer when he was drinking copious amounts of Gatorade and liquid IV.  Continues to work daily on his feet most of the day at a machine shop.  Leg swelling tends to improve at nighttime and morning time when he is off his feet. - Exam showed 2+ bilateral lower extremity edema and lung CTAB.  No dyspnea, orthopnea, PND.  Has had weight gain over the past several months.  No leg pain - Plan for echocardiogram to reevaluate LV function.  Last echo in 2008 -Start furosemide 40 mg daily with 20 mEq potassium supplementation - BMET x 2 weeks  General Health maintenance He is no longer established with a PCP after his prior PCP retired.  I have educated him to establish care with [place] [facility]:  Return in about 3 months (around 10/01/2024).  Signed, Lum LITTIE Louis, NP

## 2024-07-01 NOTE — Patient Instructions (Signed)
 Medication Instructions:  Your physician has recommended you make the following change in your medication:   START Lasix 40 mg taking 1 daily  START Potassium 20 meq taking 1 daily  *If you need a refill on your cardiac medications before your next appointment, please call your pharmacy*  Lab Work: 07/15/2024:  come to the lab for:  BMET  If you have labs (blood work) drawn today and your tests are completely normal, you will receive your results only by: MyChart Message (if you have MyChart) OR A paper copy in the mail If you have any lab test that is abnormal or we need to change your treatment, we will call you to review the results.  Testing/Procedures: Your physician has requested that you have an echocardiogram. Echocardiography is a painless test that uses sound waves to create images of your heart. It provides your doctor with information about the size and shape of your heart and how well your heart's chambers and valves are working. This procedure takes approximately one hour. There are no restrictions for this procedure. Please do NOT wear cologne, perfume, aftershave, or lotions (deodorant is allowed). Please arrive 15 minutes prior to your appointment time.  Please note: We ask at that you not bring children with you during ultrasound (echo/ vascular) testing. Due to room size and safety concerns, children are not allowed in the ultrasound rooms during exams. Our front office staff cannot provide observation of children in our lobby area while testing is being conducted. An adult accompanying a patient to their appointment will only be allowed in the ultrasound room at the discretion of the ultrasound technician under special circumstances. We apologize for any inconvenience.   Follow-Up: At [facility], you and your health needs are our priority.  As part of our continuing mission to provide you with exceptional heart care, our providers are all part of one team.  This  team includes your primary Cardiologist (physician) and Advanced Practice Providers or APPs (Physician Assistants and Nurse Practitioners) who all work together to provide you with the care you need, when you need it.  Your next appointment:   3 month(s)  Provider:   Dorn Lesches, MD    We recommend signing up for the patient portal called MyChart.  Sign up information is provided on this After Visit Summary.  MyChart is used to connect with patients for Virtual Visits (Telemedicine).  Patients are able to view lab/test results, encounter notes, upcoming appointments, etc.  Non-urgent messages can be sent to your provider as well.   To learn more about what you can do with MyChart, go to [email or web address].   Other Instructions

## 2024-07-15 DIAGNOSIS — R600 Localized edema: Secondary | ICD-10-CM | POA: Diagnosis not present

## 2024-07-15 LAB — BASIC METABOLIC PANEL WITH GFR
BUN/Creatinine Ratio: 10 (ref 10–24)
BUN: 9 mg/dL (ref 8–27)
CO2: 24 mmol/L (ref 20–29)
Calcium: 8.7 mg/dL (ref 8.6–10.2)
Chloride: 100 mmol/L (ref 96–106)
Creatinine, Ser: 0.87 mg/dL (ref 0.76–1.27)
Glucose: 130 mg/dL — ABNORMAL HIGH (ref 70–99)
Potassium: 4.4 mmol/L (ref 3.5–5.2)
Sodium: 137 mmol/L (ref 134–144)
eGFR: 91 mL/min/1.73 (ref >59)

## 2024-07-18 ENCOUNTER — Ambulatory Visit: Payer: Self-pay | Admitting: Emergency Medicine

## 2024-07-21 NOTE — Progress Notes (Signed)
 Spoke with patient. Asked that a letter be mailed explaining the results. Letter mailed

## 2024-08-15 ENCOUNTER — Ambulatory Visit (HOSPITAL_COMMUNITY)
Admission: RE | Admit: 2024-08-15 | Discharge: 2024-08-15 | Disposition: A | Discharge status: Home / Self Care | Source: Ambulatory Visit | Attending: Cardiovascular Disease | Admitting: Cardiovascular Disease

## 2024-08-15 DIAGNOSIS — R600 Localized edema: Secondary | ICD-10-CM | POA: Diagnosis not present

## 2024-08-15 LAB — ECHOCARDIOGRAM COMPLETE
Area-P 1/2: 14.31 cm2
S' Lateral: 4.3 cm

## 2024-08-17 ENCOUNTER — Other Ambulatory Visit (HOSPITAL_COMMUNITY): Payer: Self-pay

## 2024-08-17 ENCOUNTER — Telehealth: Payer: Self-pay

## 2024-08-17 NOTE — Telephone Encounter (Signed)
 I was able to get the patient added into the [employer] portal, however the grant has been flagged for diagnosis verification. I will submit the form into the portal today, but please allow a few business days for the [facility] team to review and release the funds. Status is pending.

## 2024-08-17 NOTE — Telephone Encounter (Signed)
-----   Message from Camelia JONETTA Slade sent at 08/17/2024  9:36 AM EST ----- Good morning this patient says that he went to the pharmacy recently and the pharmacy said that he had to pay $400 for his Repatha . He said that his pharmacy usually pays for his Repatha . Can you please look into this for me as patient says that he cannot afford $400! Thanks in advance.

## 2024-08-30 NOTE — Telephone Encounter (Signed)
 GRANT CLEARED IN [place].    The patient was approved for a [employer] grant that will help cover the cost of REPATHA  Total amount awarded, $2,500.  Effective: 07/18/24 - 07/17/25   APW:[id number] ERW:[id number] Hmnle:[id number] PI:[id number]   Ileana Lehmann, CPhT  Pharmacy Patient Advocate Specialist  Direct Number: [phone] Fax: [phone]

## 2024-09-01 DIAGNOSIS — M25562 Pain in left knee: Secondary | ICD-10-CM | POA: Diagnosis not present

## 2024-09-09 DIAGNOSIS — Z961 Presence of intraocular lens: Secondary | ICD-10-CM | POA: Diagnosis not present

## 2024-09-09 DIAGNOSIS — H52203 Unspecified astigmatism, bilateral: Secondary | ICD-10-CM | POA: Diagnosis not present

## 2024-09-09 DIAGNOSIS — H4052X2 Glaucoma secondary to other eye disorders, left eye, moderate stage: Secondary | ICD-10-CM | POA: Diagnosis not present

## 2024-10-03 ENCOUNTER — Ambulatory Visit: Admitting: Cardiovascular Disease

## 2024-10-12 ENCOUNTER — Encounter: Payer: Self-pay | Admitting: Cardiovascular Disease

## 2024-10-12 ENCOUNTER — Ambulatory Visit: Attending: Cardiovascular Disease | Admitting: Cardiovascular Disease

## 2024-10-12 VITALS — BP 138/76 | HR 84 | Ht 75.0 in | Wt 275.6 lb

## 2024-10-12 DIAGNOSIS — E782 Mixed hyperlipidemia: Secondary | ICD-10-CM

## 2024-10-12 DIAGNOSIS — I10 Essential (primary) hypertension: Secondary | ICD-10-CM

## 2024-10-12 DIAGNOSIS — I2510 Atherosclerotic heart disease of native coronary artery without angina pectoris: Secondary | ICD-10-CM | POA: Diagnosis not present

## 2024-10-12 DIAGNOSIS — R600 Localized edema: Secondary | ICD-10-CM | POA: Diagnosis not present

## 2024-10-12 NOTE — Assessment & Plan Note (Signed)
 History of essential hypertension blood pressure measured today 138/76.  He is on metoprolol .

## 2024-10-12 NOTE — Assessment & Plan Note (Signed)
 History of CAD status post bypass grafting by Dr. Lucas October 2008 with a LIMA to his LAD, vein to the 1st and 2nd marginal branches sequentially and a vein to the PDA.  His EF initially was 35% which normalized after that.  He denies chest pain or shortness of breath.

## 2024-10-12 NOTE — Assessment & Plan Note (Signed)
 History of hyperlipidemia on Repatha  with lipid profile performed 08/01/2024 revealing total cholesterol 126, LDL of 63 and HDL of 43.

## 2024-10-12 NOTE — Assessment & Plan Note (Signed)
 Patient saw Lum Louis FNP in October for lower extreme edema.  He was drinking a lot of Gatorade.  Has begun on furosemide  and potassium repletion.  A 2D echocardiogram performed 08/11/2024 revealed normal LV systolic function, normal diastolic function with normal valvular function as well.  He still has 1+ lower extremity edema.  He has decreased his salt intake however.

## 2024-10-12 NOTE — Progress Notes (Signed)
 "     10/12/2024 Harold Garner   January 03, 1951  [id number]  Primary Physician Patient, No Pcp Per Primary Cardiologist: Harold JINNY Lesches MD FACP, [place], [place], [place]  HPI:  Harold Garner is a 74 y.o.   moderately overweight married Caucasian male with no children who I last saw 01/20/2024.  His wife Harold Garner unfortunately has been recently diagnosed with Parkinson's disease.  He still works at a insurance claims handler.  He has a history of CAD status post coronary artery bypass grafting in October of 2008 by Harold Garner. He had a LIMA to his LAD, a vein to a 1st and 2nd marginal branch sequentially, and a vein to the PDA. His EF was 35% at that time with followup echo revealing normalization of his ejection fraction 12/08. He also has hyperlipidemia, intolerant to statin drugs. He denies chest pain or shortness of breath. Because of his statin intolerance he was begun on Repatha  .  He has had an excellent response to Repatha .   In February 2020 he was complaining of fatigue and shortness of breath.  He was ultimately found to be anemic and underwent transfusion.  He subsequently felt clinically improved.   Since I saw him a year ago he did see Harold Louis FNP because of lower extremity edema.  A 2D echo performed 08/11/2024 revealed normal LV systolic and diastolic function and normal valvular function.  He was started on oral diuretic and potassium repletion.  He still has some mild lower extreme edema..     Active Medications[1]   Allergies[2]  Social History   Socioeconomic History   Marital status: Married    Spouse name: Not on file   Number of children: Not on file   Years of education: Not on file   Highest education level: Not on file  Occupational History   Not on file  Tobacco Use   Smoking status: Never   Smokeless tobacco: Never   Tobacco comments:    10/24/2022 Patient chew on cigars  Vaping Use   Vaping status: Never Used  Substance and Sexual Activity   Alcohol use: Yes     Alcohol/week: 3.0 standard drinks of alcohol    Types: 3 Standard drinks or equivalent per week   Drug use: No   Sexual activity: Not on file  Other Topics Concern   Not on file  Social History Narrative   Not on file   Social Drivers of Health   Tobacco Use: Low Risk (10/12/2024)   Patient History    Smoking Tobacco Use: Never    Smokeless Tobacco Use: Never    Passive Exposure: Not on file  Recent Concern: Tobacco Use - High Risk (10/03/2024)   Received from [facility]   Patient History    Smoking Tobacco Use: Some Days    Smokeless Tobacco Use: Never    Passive Exposure: Not on file  Financial Resource Strain: Not on file  Food Insecurity: Not on file  Transportation Needs: Not on file  Physical Activity: Not on file  Stress: Not on file  Social Connections: Not on file  Intimate Partner Violence: Not on file  Depression (EYV7-0): Not on file  Alcohol Screen: Not on file  Housing: Low Risk (10/03/2024)   Received from [facility]   Epic    What is your living situation today?: I have a steady place to live    Think about the place you live. Do you have problems with any of the following?  Choose all that apply:: Not on file  Utilities: Not on file  Health Literacy: Not on file     Review of Systems: General: negative for chills, fever, night sweats or weight changes.  Cardiovascular: negative for chest pain, dyspnea on exertion, edema, orthopnea, palpitations, paroxysmal nocturnal dyspnea or shortness of breath Dermatological: negative for rash Respiratory: negative for cough or wheezing Urologic: negative for hematuria Abdominal: negative for nausea, vomiting, diarrhea, bright red blood per rectum, melena, or hematemesis Neurologic: negative for visual changes, syncope, or dizziness All other systems reviewed and are otherwise negative except as noted above.    Blood pressure 138/76, pulse 84, height 6' 3 (1.905 m), weight 275 lb 9.6 oz (125 kg), SpO2  95%.  General appearance: alert and no distress Neck: no adenopathy, no carotid bruit, no JVD, supple, symmetrical, trachea midline, and thyroid  not enlarged, symmetric, no tenderness/mass/nodules Lungs: clear to auscultation bilaterally Heart: regular rate and rhythm, S1, S2 normal, no murmur, click, rub or gallop Extremities: extremities normal, atraumatic, no cyanosis or edema Pulses: 2+ and symmetric Skin: Skin color, texture, turgor normal. No rashes or lesions Neurologic: Grossly normal  EKG EKG Interpretation Date/Time:  Wednesday October 12 2024 15:27:15 EST Ventricular Rate:  84 PR Interval:  152 QRS Duration:  96 QT Interval:  368 QTC Calculation: 434 R Axis:   14  Text Interpretation: Sinus rhythm with Premature atrial complexes Nonspecific ST and T wave abnormality When compared with ECG of 20-Jan-2024 08:20, Premature atrial complexes are now Present Nonspecific T wave abnormality now evident in Inferior leads Nonspecific T wave abnormality has replaced inverted T waves in Lateral leads Confirmed by Garner Carrier [phone]) on 10/12/2024 3:30:06 PM    ASSESSMENT AND PLAN:   Essential hypertension History of essential hypertension blood pressure measured today 138/76.  He is on metoprolol .  Hyperlipidemia History of hyperlipidemia on Repatha  with lipid profile performed 08/01/2024 revealing total cholesterol 126, LDL of 63 and HDL of 43.  Coronary artery disease History of CAD status post bypass grafting by Dr. Lucas October 2008 with a LIMA to his LAD, vein to the 1st and 2nd marginal branches sequentially and a vein to the PDA.  His EF initially was 35% which normalized after that.  He denies chest pain or shortness of breath.  Bilateral lower extremity edema Patient saw Harold Louis FNP in October for lower extreme edema.  He was drinking a lot of Gatorade.  Has begun on furosemide  and potassium repletion.  A 2D echocardiogram performed 08/11/2024 revealed normal  LV systolic function, normal diastolic function with normal valvular function as well.  He still has 1+ lower extremity edema.  He has decreased his salt intake however.     Carrier Harold Court MD FACP,FACC,FAHA, FSCAI 10/12/2024 3:41 PM    [1]  Current Meds  Medication Sig   aspirin 81 MG tablet Take 81 mg by mouth every morning.  (Patient taking differently: Take 81 mg by mouth as needed.)   brimonidine (ALPHAGAN) 0.2 % ophthalmic solution SMARTSIG:In Eye(s)   esomeprazole (NEXIUM) 20 MG capsule Take 20 mg by mouth daily.    fluticasone (FLONASE) 50 MCG/ACT nasal spray Place 1 spray into both nostrils daily as needed for allergies or rhinitis.   furosemide  (LASIX ) 40 MG tablet Take 1 tablet (40 mg total) by mouth daily.   ibuprofen (ADVIL) 200 MG tablet Take 200 mg by mouth every 6 (six) hours as needed for headache or moderate pain (pain score 4-6).   metoprolol  succinate (TOPROL -XL)  25 MG 24 hr tablet TAKE 1 TABLET BY MOUTH DAILY   Multiple Vitamin (MULTIVITAMIN) tablet Take 1 tablet by mouth daily.   naproxen sodium (ALEVE) 220 MG tablet Take 220 mg by mouth daily as needed (pain).   potassium chloride  SA (KLOR-CON  M20) 20 MEQ tablet Take 1 tablet (20 mEq total) by mouth daily.   REPATHA  SURECLICK 140 MG/ML SOAJ INJECT 140MG  UNDER THE SKIN EVERY 14 DAYS   timolol (TIMOPTIC) 0.5 % ophthalmic solution SMARTSIG:In Eye(s)  [2]  Allergies Allergen Reactions   Darvon [Propoxyphene Hcl] Itching   Statins Other (See Comments)    Has tried pravachol, crestor  and lipitor, causes muscle pain   "

## 2024-10-12 NOTE — Patient Instructions (Signed)
 Medication Instructions:  Your physician recommends that you continue on your current medications as directed. Please refer to the Current Medication list given to you today.  *If you need a refill on your cardiac medications before your next appointment, please call your pharmacy*   Follow-Up: At [facility], you and your health needs are our priority.  As part of our continuing mission to provide you with exceptional heart care, our providers are all part of one team.  This team includes your primary Cardiologist (physician) and Advanced Practice Providers or APPs (Physician Assistants and Nurse Practitioners) who all work together to provide you with the care you need, when you need it.  Your next appointment:   3 month(s)  Provider:   Lum Louis, NP         Then, Dorn Lesches, MD will plan to see you again in 12 month(s).     We recommend signing up for the patient portal called MyChart.  Sign up information is provided on this After Visit Summary.  MyChart is used to connect with patients for Virtual Visits (Telemedicine).  Patients are able to view lab/test results, encounter notes, upcoming appointments, etc.  Non-urgent messages can be sent to your provider as well.   To learn more about what you can do with MyChart, go to [email or web address].   Other Instructions

## 2025-01-10 ENCOUNTER — Ambulatory Visit: Admitting: Emergency Medicine
